# Patient Record
Sex: Female | Born: 1945 | Race: Black or African American | Hispanic: No | Marital: Married | Smoking: Former smoker
Health system: Southern US, Community
[De-identification: ages and names within clinical notes are randomized; demographics above are authoritative.]

## PROBLEM LIST (undated history)

## (undated) DIAGNOSIS — E7800 Pure hypercholesterolemia, unspecified: Secondary | ICD-10-CM

## (undated) DIAGNOSIS — I10 Essential (primary) hypertension: Secondary | ICD-10-CM

## (undated) DIAGNOSIS — E876 Hypokalemia: Secondary | ICD-10-CM

## (undated) DIAGNOSIS — E079 Disorder of thyroid, unspecified: Secondary | ICD-10-CM

## (undated) DIAGNOSIS — I639 Cerebral infarction, unspecified: Secondary | ICD-10-CM

---

## 1997-09-13 DIAGNOSIS — Z8679 Personal history of other diseases of the circulatory system: Secondary | ICD-10-CM | POA: Insufficient documentation

## 1997-09-24 ENCOUNTER — Inpatient Hospital Stay (HOSPITAL_COMMUNITY): Admission: EM | Admit: 1997-09-24 | Discharge: 1997-09-28 | Payer: Self-pay | Admitting: Emergency Medicine

## 1997-09-28 ENCOUNTER — Inpatient Hospital Stay (HOSPITAL_COMMUNITY)
Admission: RE | Admit: 1997-09-28 | Discharge: 1997-10-14 | Payer: Self-pay | Admitting: Physical Medicine & Rehabilitation

## 1997-10-17 ENCOUNTER — Encounter
Admission: RE | Admit: 1997-10-17 | Discharge: 1998-01-15 | Payer: Self-pay | Admitting: Physical Medicine & Rehabilitation

## 1998-12-29 ENCOUNTER — Other Ambulatory Visit: Admission: RE | Admit: 1998-12-29 | Discharge: 1998-12-29 | Payer: Self-pay | Admitting: Family Medicine

## 1999-01-18 ENCOUNTER — Ambulatory Visit (HOSPITAL_COMMUNITY): Admission: RE | Admit: 1999-01-18 | Discharge: 1999-01-18 | Payer: Self-pay | Admitting: Internal Medicine

## 1999-01-18 ENCOUNTER — Encounter: Payer: Self-pay | Admitting: Internal Medicine

## 2000-06-10 ENCOUNTER — Ambulatory Visit (HOSPITAL_COMMUNITY): Admission: RE | Admit: 2000-06-10 | Discharge: 2000-06-10 | Payer: Self-pay | Admitting: Internal Medicine

## 2000-06-10 ENCOUNTER — Encounter: Payer: Self-pay | Admitting: Internal Medicine

## 2002-04-02 DIAGNOSIS — Z8639 Personal history of other endocrine, nutritional and metabolic disease: Secondary | ICD-10-CM | POA: Insufficient documentation

## 2002-04-02 DIAGNOSIS — Z862 Personal history of diseases of the blood and blood-forming organs and certain disorders involving the immune mechanism: Secondary | ICD-10-CM

## 2007-04-28 ENCOUNTER — Emergency Department (HOSPITAL_COMMUNITY): Admission: EM | Admit: 2007-04-28 | Discharge: 2007-04-28 | Payer: Self-pay | Admitting: Emergency Medicine

## 2007-05-14 ENCOUNTER — Ambulatory Visit: Payer: Self-pay | Admitting: Internal Medicine

## 2007-05-14 ENCOUNTER — Ambulatory Visit: Payer: Self-pay | Admitting: *Deleted

## 2007-05-14 DIAGNOSIS — E782 Mixed hyperlipidemia: Secondary | ICD-10-CM

## 2007-05-14 DIAGNOSIS — J309 Allergic rhinitis, unspecified: Secondary | ICD-10-CM | POA: Insufficient documentation

## 2007-05-14 DIAGNOSIS — I10 Essential (primary) hypertension: Secondary | ICD-10-CM

## 2007-05-14 LAB — CONVERTED CEMR LAB
Bilirubin Urine: NEGATIVE
Glucose, Urine, Semiquant: NEGATIVE
Ketones, urine, test strip: NEGATIVE
Nitrite: NEGATIVE
Protein, U semiquant: NEGATIVE
Urobilinogen, UA: NEGATIVE

## 2007-05-16 ENCOUNTER — Encounter (INDEPENDENT_AMBULATORY_CARE_PROVIDER_SITE_OTHER): Payer: Self-pay | Admitting: Internal Medicine

## 2007-05-21 LAB — CONVERTED CEMR LAB
AST: 22 units/L (ref 0–37)
Albumin: 4.7 g/dL (ref 3.5–5.2)
Alkaline Phosphatase: 67 units/L (ref 39–117)
BUN: 22 mg/dL (ref 6–23)
Basophils Relative: 1 % (ref 0–1)
CO2: 23 meq/L (ref 19–32)
Calcium: 10.2 mg/dL (ref 8.4–10.5)
Chloride: 103 meq/L (ref 96–112)
Cholesterol: 170 mg/dL (ref 0–200)
Eosinophils Absolute: 0.1 10*3/uL (ref 0.0–0.7)
Eosinophils Relative: 2 % (ref 0–5)
Glucose, Bld: 94 mg/dL (ref 70–99)
HDL: 41 mg/dL (ref >39)
Hemoglobin: 14.8 g/dL (ref 12.0–15.0)
MCHC: 33.9 g/dL (ref 30.0–36.0)
MCV: 91.8 fL (ref 78.0–100.0)
Neutro Abs: 5.3 10*3/uL (ref 1.7–7.7)
RBC: 4.75 M/uL (ref 3.87–5.11)
Sodium: 136 meq/L (ref 135–145)
WBC: 7.8 10*3/uL (ref 4.0–10.5)

## 2007-05-22 ENCOUNTER — Ambulatory Visit: Payer: Self-pay | Admitting: Internal Medicine

## 2007-05-25 ENCOUNTER — Encounter (INDEPENDENT_AMBULATORY_CARE_PROVIDER_SITE_OTHER): Payer: Self-pay | Admitting: Internal Medicine

## 2007-05-27 ENCOUNTER — Encounter (INDEPENDENT_AMBULATORY_CARE_PROVIDER_SITE_OTHER): Payer: Self-pay | Admitting: Internal Medicine

## 2007-05-27 LAB — CONVERTED CEMR LAB
Albumin ELP: 48 % — ABNORMAL LOW (ref 55.8–66.1)
Alpha-2-Globulin: 10.6 % (ref 7.1–11.8)
Beta Globulin: 5.3 % (ref 4.7–7.2)
IgA: 438 mg/dL — ABNORMAL HIGH (ref 68–378)
IgG (Immunoglobin G), Serum: 2480 mg/dL — ABNORMAL HIGH (ref 694–1618)
Total Protein, Serum Electrophoresis: 9.1 g/dL — ABNORMAL HIGH (ref 6.0–8.3)

## 2007-06-02 ENCOUNTER — Encounter (INDEPENDENT_AMBULATORY_CARE_PROVIDER_SITE_OTHER): Payer: Self-pay | Admitting: Internal Medicine

## 2007-06-08 ENCOUNTER — Encounter (INDEPENDENT_AMBULATORY_CARE_PROVIDER_SITE_OTHER): Payer: Self-pay | Admitting: *Deleted

## 2007-06-08 LAB — CONVERTED CEMR LAB

## 2007-07-21 ENCOUNTER — Encounter (INDEPENDENT_AMBULATORY_CARE_PROVIDER_SITE_OTHER): Payer: Self-pay | Admitting: Internal Medicine

## 2007-07-21 ENCOUNTER — Ambulatory Visit: Payer: Self-pay | Admitting: Internal Medicine

## 2007-07-21 DIAGNOSIS — D259 Leiomyoma of uterus, unspecified: Secondary | ICD-10-CM | POA: Insufficient documentation

## 2007-07-21 LAB — CONVERTED CEMR LAB
Bilirubin Urine: NEGATIVE
Blood in Urine, dipstick: NEGATIVE
Ketones, urine, test strip: NEGATIVE
Specific Gravity, Urine: 1.01
Urobilinogen, UA: 1
pH: 5

## 2007-07-27 LAB — CONVERTED CEMR LAB
AST: 21 units/L (ref 0–37)
Albumin: 4.5 g/dL (ref 3.5–5.2)
Basophils Relative: 1 % (ref 0–1)
Chloride: 104 meq/L (ref 96–112)
Eosinophils Absolute: 0.1 10*3/uL (ref 0.0–0.7)
Eosinophils Relative: 1 % (ref 0–5)
GC Probe Amp, Genital: NEGATIVE
HCT: 40.7 % (ref 36.0–46.0)
HDL: 35 mg/dL — ABNORMAL LOW (ref >39)
Hemoglobin: 14.5 g/dL (ref 12.0–15.0)
Lymphocytes Relative: 18 % (ref 12–46)
Lymphs Abs: 1.3 10*3/uL (ref 0.7–4.0)
MCHC: 35.6 g/dL (ref 30.0–36.0)
MCV: 88.3 fL (ref 78.0–100.0)
Monocytes Absolute: 0.4 10*3/uL (ref 0.1–1.0)
Sodium: 138 meq/L (ref 135–145)
Total Bilirubin: 0.6 mg/dL (ref 0.3–1.2)
Total CHOL/HDL Ratio: 4.2
Triglycerides: 196 mg/dL — ABNORMAL HIGH (ref <150)
VLDL: 39 mg/dL (ref 0–40)
WBC: 7.5 10*3/uL (ref 4.0–10.5)

## 2007-08-14 ENCOUNTER — Ambulatory Visit: Payer: Self-pay | Admitting: Internal Medicine

## 2007-08-15 ENCOUNTER — Encounter (INDEPENDENT_AMBULATORY_CARE_PROVIDER_SITE_OTHER): Payer: Self-pay | Admitting: Internal Medicine

## 2007-08-21 ENCOUNTER — Ambulatory Visit: Payer: Self-pay | Admitting: Internal Medicine

## 2007-08-21 DIAGNOSIS — N259 Disorder resulting from impaired renal tubular function, unspecified: Secondary | ICD-10-CM | POA: Insufficient documentation

## 2007-08-21 LAB — CONVERTED CEMR LAB
C3 Complement: 166 mg/dL (ref 88–201)
Collection Interval-CRCL: 24 hr
Complement C4, Body Fluid: 24 mg/dL (ref 16–47)
Creatinine, Urine: 76.9 mg/dL
IgG (Immunoglobin G), Serum: 2220 mg/dL — ABNORMAL HIGH (ref 694–1618)
Sed Rate: 35 mm/hr — ABNORMAL HIGH (ref 0–22)

## 2007-08-26 ENCOUNTER — Ambulatory Visit: Payer: Self-pay | Admitting: Internal Medicine

## 2007-08-30 ENCOUNTER — Encounter (INDEPENDENT_AMBULATORY_CARE_PROVIDER_SITE_OTHER): Payer: Self-pay | Admitting: Internal Medicine

## 2007-09-02 ENCOUNTER — Ambulatory Visit (HOSPITAL_COMMUNITY): Admission: RE | Admit: 2007-09-02 | Discharge: 2007-09-02 | Payer: Self-pay | Admitting: Internal Medicine

## 2007-09-04 ENCOUNTER — Telehealth (INDEPENDENT_AMBULATORY_CARE_PROVIDER_SITE_OTHER): Payer: Self-pay | Admitting: Internal Medicine

## 2007-09-10 ENCOUNTER — Ambulatory Visit: Payer: Self-pay | Admitting: Nurse Practitioner

## 2007-09-11 ENCOUNTER — Ambulatory Visit: Payer: Self-pay | Admitting: Internal Medicine

## 2007-09-11 DIAGNOSIS — R609 Edema, unspecified: Secondary | ICD-10-CM

## 2007-10-23 ENCOUNTER — Ambulatory Visit: Payer: Self-pay | Admitting: Internal Medicine

## 2007-10-23 LAB — CONVERTED CEMR LAB
CO2: 19 meq/L (ref 19–32)
Chloride: 107 meq/L (ref 96–112)
Creatinine, Ser: 1.09 mg/dL (ref 0.40–1.20)
Sodium: 140 meq/L (ref 135–145)

## 2007-11-13 ENCOUNTER — Ambulatory Visit: Payer: Self-pay | Admitting: Internal Medicine

## 2007-11-20 ENCOUNTER — Ambulatory Visit: Payer: Self-pay | Admitting: Internal Medicine

## 2007-12-01 ENCOUNTER — Ambulatory Visit (HOSPITAL_COMMUNITY): Admission: RE | Admit: 2007-12-01 | Discharge: 2007-12-01 | Payer: Self-pay | Admitting: Internal Medicine

## 2007-12-07 ENCOUNTER — Telehealth (INDEPENDENT_AMBULATORY_CARE_PROVIDER_SITE_OTHER): Payer: Self-pay | Admitting: Internal Medicine

## 2008-01-12 ENCOUNTER — Ambulatory Visit: Payer: Self-pay | Admitting: Internal Medicine

## 2008-01-27 ENCOUNTER — Ambulatory Visit: Payer: Self-pay | Admitting: Internal Medicine

## 2008-06-30 ENCOUNTER — Ambulatory Visit: Payer: Self-pay | Admitting: Internal Medicine

## 2008-08-01 ENCOUNTER — Ambulatory Visit: Payer: Self-pay | Admitting: Internal Medicine

## 2008-08-07 LAB — CONVERTED CEMR LAB
AST: 22 units/L (ref 0–37)
Alkaline Phosphatase: 82 units/L (ref 39–117)
BUN: 13 mg/dL (ref 6–23)
Basophils Relative: 1 % (ref 0–1)
Chloride: 104 meq/L (ref 96–112)
Creatinine, Ser: 1.21 mg/dL — ABNORMAL HIGH (ref 0.40–1.20)
Eosinophils Relative: 3 % (ref 0–5)
HDL: 27 mg/dL — ABNORMAL LOW (ref >39)
Lymphocytes Relative: 27 % (ref 12–46)
Lymphs Abs: 1.6 10*3/uL (ref 0.7–4.0)
MCHC: 35.2 g/dL (ref 30.0–36.0)
Monocytes Absolute: 0.6 10*3/uL (ref 0.1–1.0)
Neutro Abs: 3.5 10*3/uL (ref 1.7–7.7)
Platelets: 163 10*3/uL (ref 150–400)
Potassium: 3.5 meq/L (ref 3.5–5.3)
Total Bilirubin: 0.7 mg/dL (ref 0.3–1.2)
Total Protein: 7.8 g/dL (ref 6.0–8.3)

## 2008-09-02 ENCOUNTER — Ambulatory Visit: Payer: Self-pay | Admitting: Internal Medicine

## 2008-09-02 DIAGNOSIS — R002 Palpitations: Secondary | ICD-10-CM

## 2008-09-02 DIAGNOSIS — R1013 Epigastric pain: Secondary | ICD-10-CM

## 2008-09-07 ENCOUNTER — Encounter (INDEPENDENT_AMBULATORY_CARE_PROVIDER_SITE_OTHER): Payer: Self-pay | Admitting: Internal Medicine

## 2008-09-15 ENCOUNTER — Encounter (INDEPENDENT_AMBULATORY_CARE_PROVIDER_SITE_OTHER): Payer: Self-pay | Admitting: Internal Medicine

## 2008-10-21 ENCOUNTER — Ambulatory Visit: Payer: Self-pay | Admitting: Internal Medicine

## 2008-10-21 ENCOUNTER — Encounter (INDEPENDENT_AMBULATORY_CARE_PROVIDER_SITE_OTHER): Payer: Self-pay | Admitting: Internal Medicine

## 2008-10-21 DIAGNOSIS — E042 Nontoxic multinodular goiter: Secondary | ICD-10-CM

## 2008-10-21 DIAGNOSIS — R1310 Dysphagia, unspecified: Secondary | ICD-10-CM | POA: Insufficient documentation

## 2008-10-21 DIAGNOSIS — Z780 Asymptomatic menopausal state: Secondary | ICD-10-CM | POA: Insufficient documentation

## 2008-10-21 LAB — CONVERTED CEMR LAB
Blood in Urine, dipstick: NEGATIVE
Glucose, Urine, Semiquant: NEGATIVE
Nitrite: NEGATIVE
Specific Gravity, Urine: 1.015
WBC Urine, dipstick: NEGATIVE
pH: 5

## 2008-10-27 ENCOUNTER — Encounter (INDEPENDENT_AMBULATORY_CARE_PROVIDER_SITE_OTHER): Payer: Self-pay | Admitting: Internal Medicine

## 2008-10-30 DIAGNOSIS — E039 Hypothyroidism, unspecified: Secondary | ICD-10-CM

## 2008-10-30 LAB — CONVERTED CEMR LAB
Chlamydia, DNA Probe: NEGATIVE
Cholesterol: 128 mg/dL (ref 0–200)
HDL: 32 mg/dL — ABNORMAL LOW (ref >39)
Total CHOL/HDL Ratio: 4
Triglycerides: 233 mg/dL — ABNORMAL HIGH (ref <150)

## 2008-10-31 ENCOUNTER — Encounter (INDEPENDENT_AMBULATORY_CARE_PROVIDER_SITE_OTHER): Payer: Self-pay | Admitting: Internal Medicine

## 2008-11-02 ENCOUNTER — Encounter (INDEPENDENT_AMBULATORY_CARE_PROVIDER_SITE_OTHER): Payer: Self-pay | Admitting: Internal Medicine

## 2008-11-02 ENCOUNTER — Ambulatory Visit (HOSPITAL_COMMUNITY): Admission: RE | Admit: 2008-11-02 | Discharge: 2008-11-02 | Payer: Self-pay | Admitting: Internal Medicine

## 2008-11-02 DIAGNOSIS — M899 Disorder of bone, unspecified: Secondary | ICD-10-CM | POA: Insufficient documentation

## 2008-11-02 DIAGNOSIS — M949 Disorder of cartilage, unspecified: Secondary | ICD-10-CM

## 2008-11-08 ENCOUNTER — Encounter: Admission: RE | Admit: 2008-11-08 | Discharge: 2008-11-08 | Payer: Self-pay | Admitting: Internal Medicine

## 2008-11-09 ENCOUNTER — Encounter (INDEPENDENT_AMBULATORY_CARE_PROVIDER_SITE_OTHER): Payer: Self-pay | Admitting: Internal Medicine

## 2008-12-22 ENCOUNTER — Ambulatory Visit: Payer: Self-pay | Admitting: Internal Medicine

## 2008-12-22 DIAGNOSIS — B079 Viral wart, unspecified: Secondary | ICD-10-CM | POA: Insufficient documentation

## 2008-12-29 ENCOUNTER — Ambulatory Visit: Payer: Self-pay | Admitting: Internal Medicine

## 2009-01-19 ENCOUNTER — Encounter (INDEPENDENT_AMBULATORY_CARE_PROVIDER_SITE_OTHER): Payer: Self-pay | Admitting: *Deleted

## 2009-03-26 IMAGING — US US RENAL
1 series · 14 of 25 positions shown · non-contrast
Comparison: None

CLINICAL DATA: Renal insufficiency.

RENAL/URINARY TRACT ULTRASOUND
TECHNIQUE: Complete ultrasound examination of the urinary tract
was performed including evaluation of the kidneys renal collecting
systems and urinary bladder.

[Series 1: unknown · 0.28mm/px · 14 of 30 slices shown]
[im 1/30]
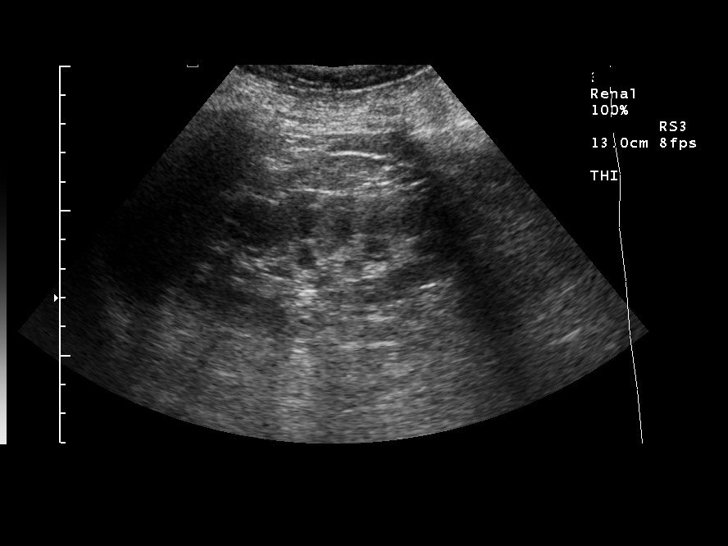
[im 3/30]
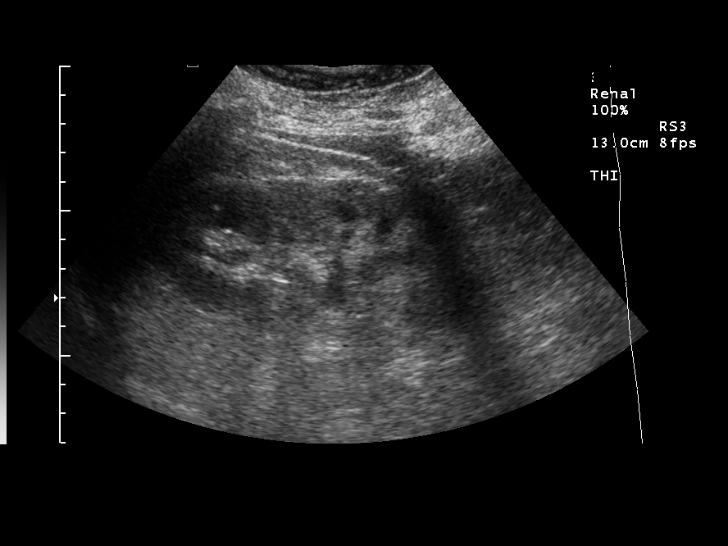
[im 5/30]
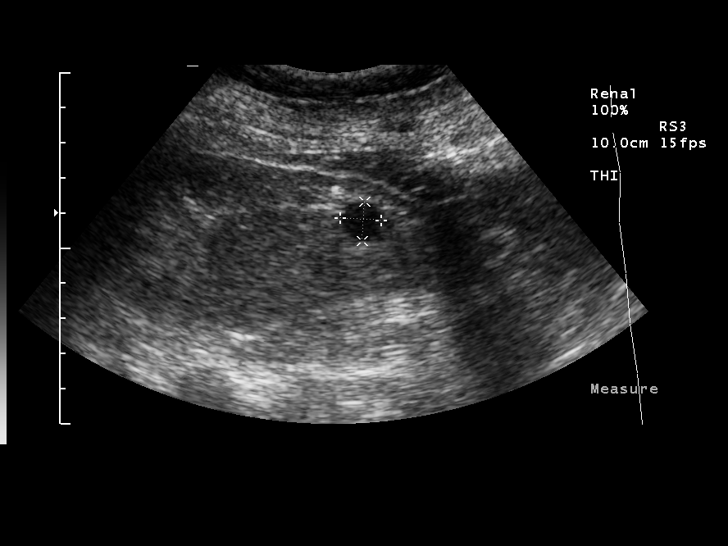
[im 8/30]
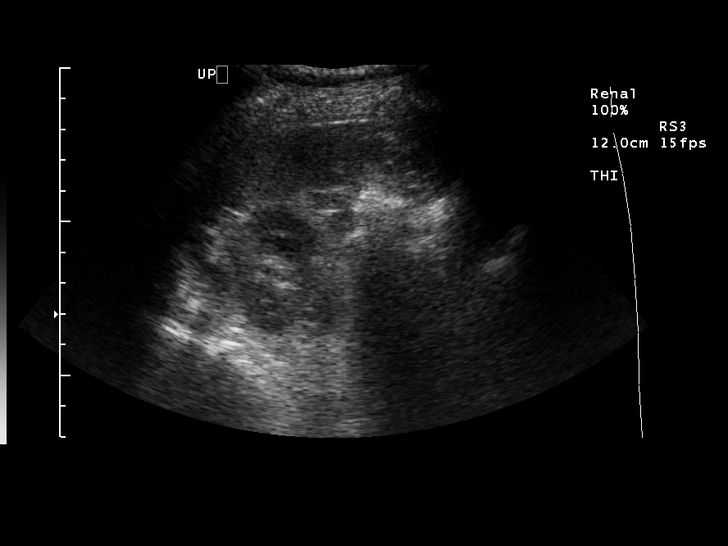
[im 10/30]
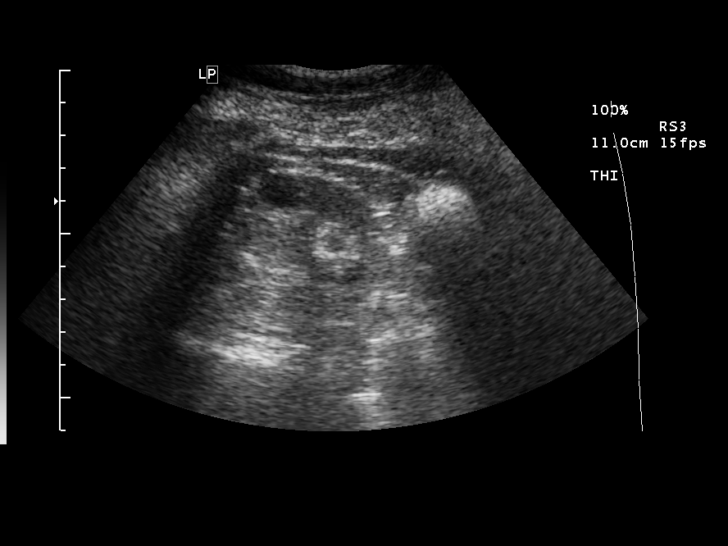
[im 11/30]
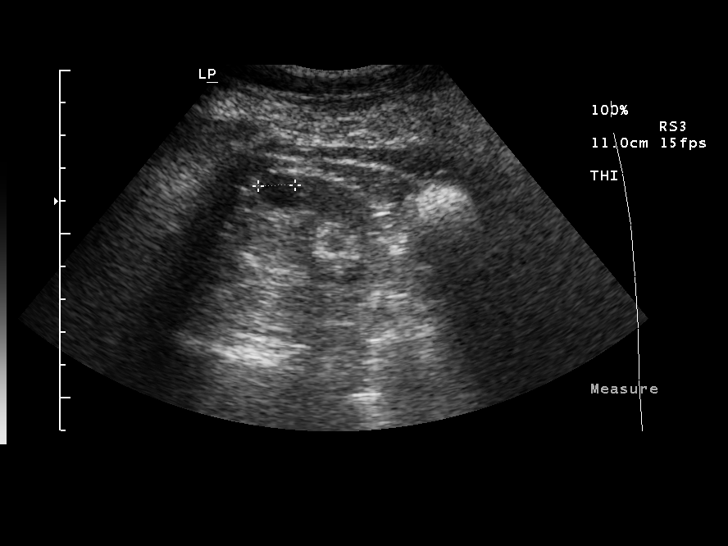
[im 14/30]
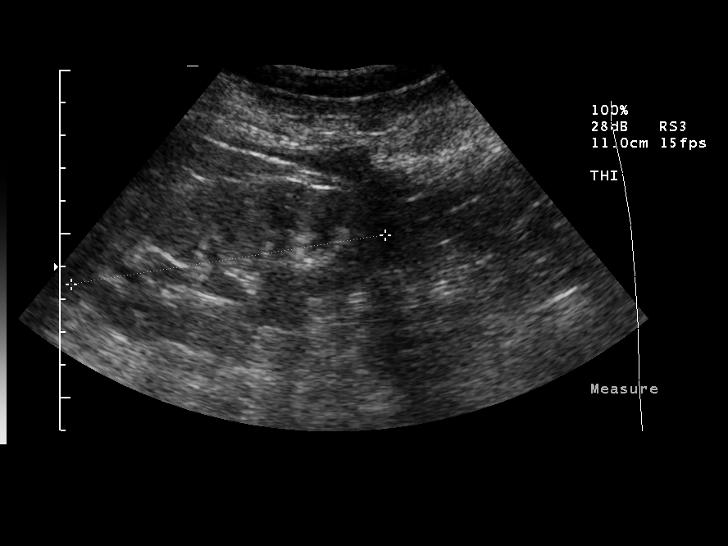
[im 16/30]
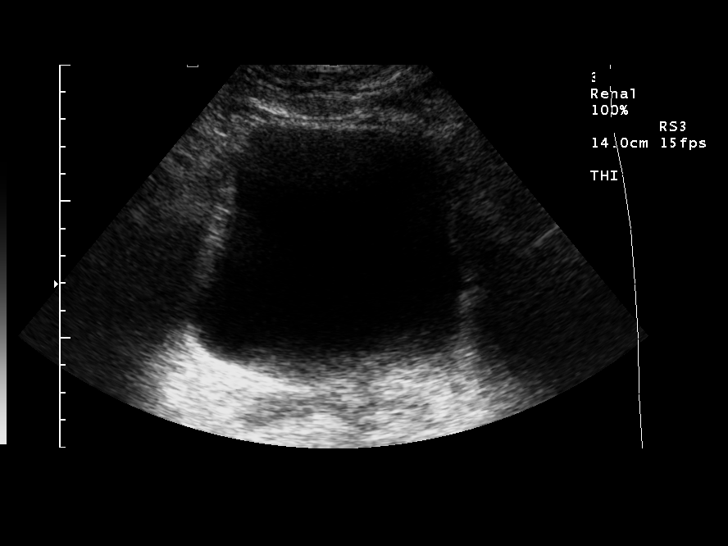
[im 19/30]
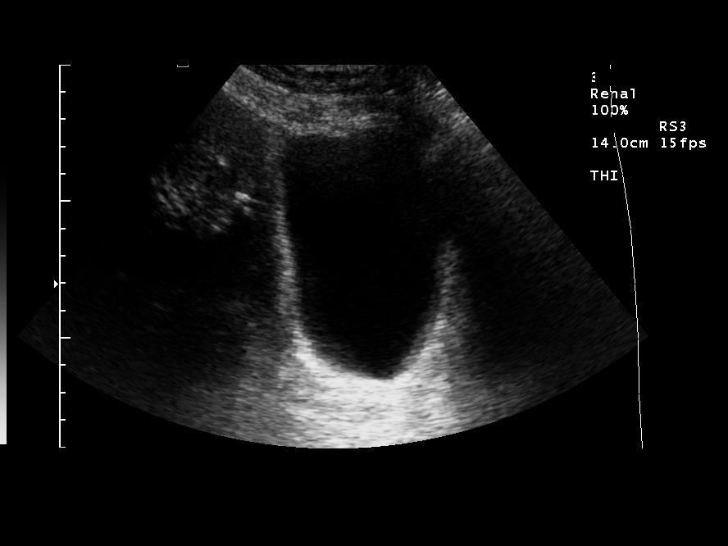
[im 20/30]
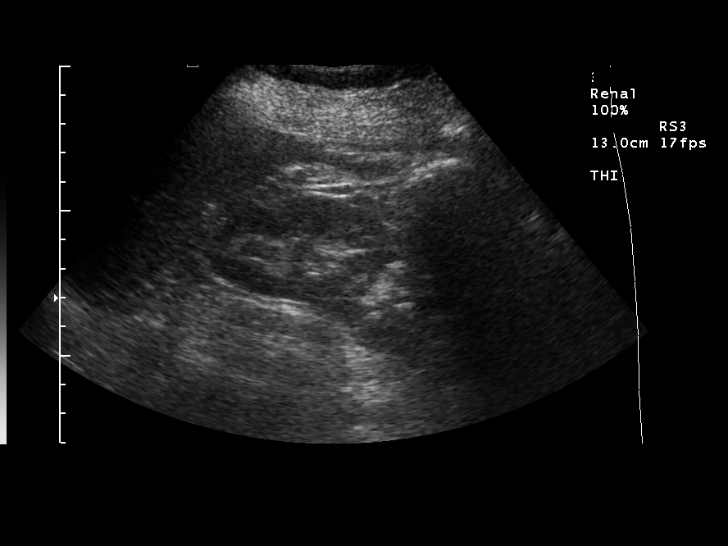
[im 22/30]
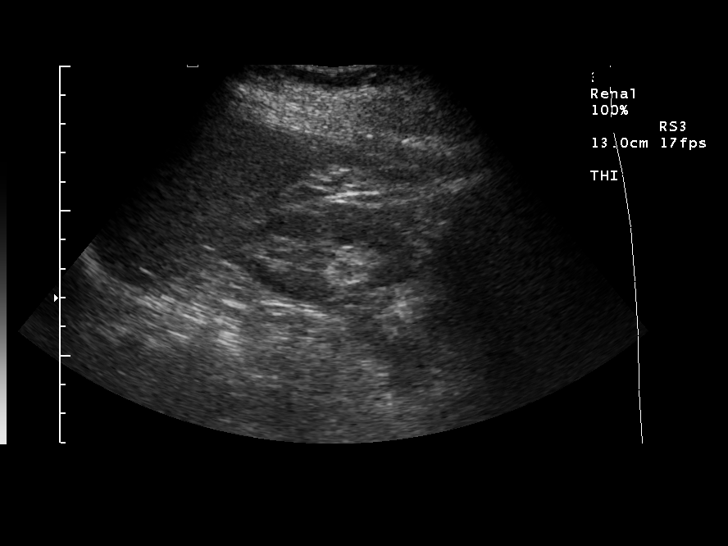
[im 25/30]
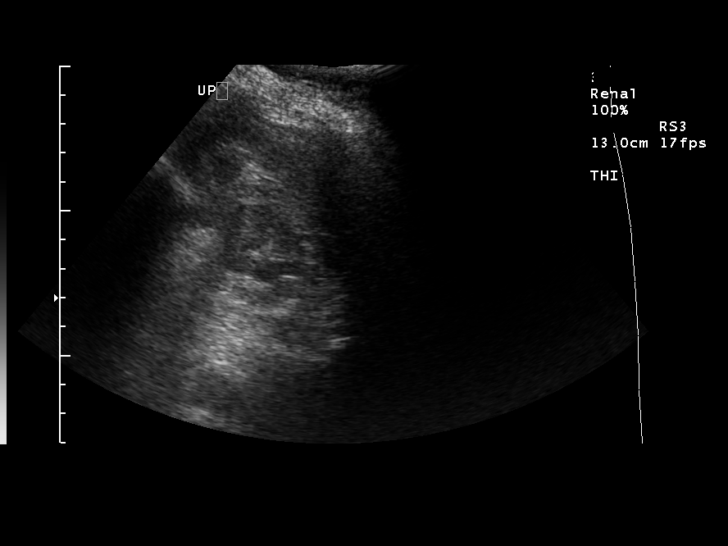
[im 27/30]
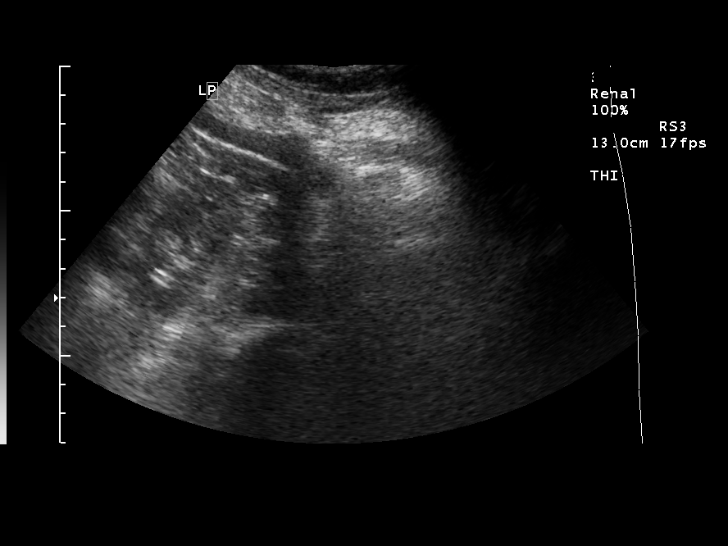
[im 30/30]
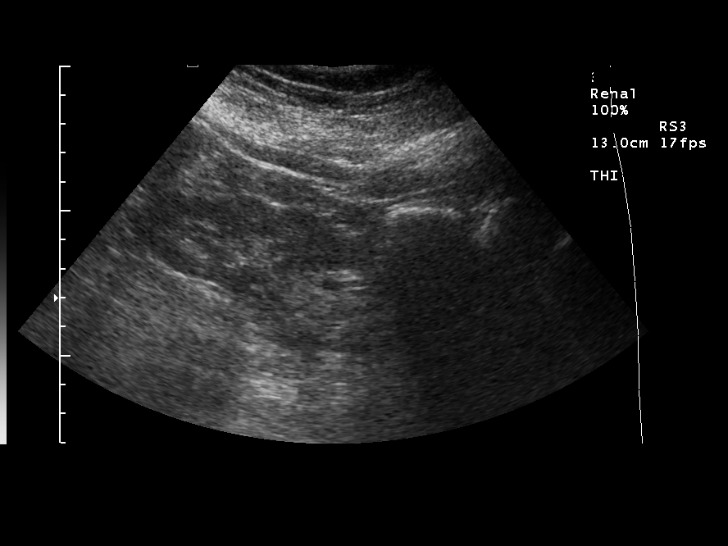

[14 of 25 positions shown; findings below may reference images not displayed]

FINDINGS: There is a simple appearing cyst within the lower pole of the right
kidney measuring 1.2 cm.

The right kidney measures 9.8 cm.  There is mild increased cortical
echogenicity suggesting chronic medical renal disease.

The left kidney measures 9.8 cm.  Mild increased cortical
echogenicity is noted.[

The urinary bladder is unremarkable.
IMPRESSION: 1.  No evidence for acute obstructive uropathy.
2.  Increased renal cortical echogenicity suggests chronic medical
renal disease.
3.  Right renal cyst.

## 2009-06-16 ENCOUNTER — Ambulatory Visit: Payer: Self-pay | Admitting: Internal Medicine

## 2009-06-16 DIAGNOSIS — N63 Unspecified lump in unspecified breast: Secondary | ICD-10-CM

## 2009-06-16 DIAGNOSIS — G4700 Insomnia, unspecified: Secondary | ICD-10-CM

## 2009-06-19 LAB — CONVERTED CEMR LAB
AST: 21 units/L (ref 0–37)
Alkaline Phosphatase: 84 units/L (ref 39–117)
BUN: 11 mg/dL (ref 6–23)
Basophils Absolute: 0 10*3/uL (ref 0.0–0.1)
Calcium: 9.5 mg/dL (ref 8.4–10.5)
Chloride: 101 meq/L (ref 96–112)
Cholesterol: 147 mg/dL (ref 0–200)
Creatinine, Ser: 1.06 mg/dL (ref 0.40–1.20)
HCT: 41.8 % (ref 36.0–46.0)
Hemoglobin: 13.9 g/dL (ref 12.0–15.0)
Lymphs Abs: 1.4 10*3/uL (ref 0.7–4.0)
MCHC: 33.3 g/dL (ref 30.0–36.0)
MCV: 93.3 fL (ref 78.0–100.0)
Neutro Abs: 4.3 10*3/uL (ref 1.7–7.7)
RBC: 4.48 M/uL (ref 3.87–5.11)
TSH: 7.193 microintl units/mL — ABNORMAL HIGH (ref 0.350–4.500)
Total Bilirubin: 0.6 mg/dL (ref 0.3–1.2)
Total CHOL/HDL Ratio: 4.9
Total Protein: 8.5 g/dL — ABNORMAL HIGH (ref 6.0–8.3)

## 2009-06-20 ENCOUNTER — Ambulatory Visit (HOSPITAL_COMMUNITY): Admission: RE | Admit: 2009-06-20 | Discharge: 2009-06-20 | Payer: Self-pay | Admitting: Internal Medicine

## 2009-06-21 ENCOUNTER — Encounter: Admission: RE | Admit: 2009-06-21 | Discharge: 2009-06-21 | Payer: Self-pay | Admitting: Internal Medicine

## 2009-12-19 ENCOUNTER — Ambulatory Visit: Payer: Self-pay | Admitting: Internal Medicine

## 2009-12-19 DIAGNOSIS — M24529 Contracture, unspecified elbow: Secondary | ICD-10-CM

## 2009-12-19 DIAGNOSIS — M25569 Pain in unspecified knee: Secondary | ICD-10-CM

## 2009-12-19 LAB — CONVERTED CEMR LAB
Blood in Urine, dipstick: NEGATIVE
Ketones, urine, test strip: NEGATIVE
Protein, U semiquant: NEGATIVE
Urobilinogen, UA: 0.2

## 2010-01-10 DIAGNOSIS — M109 Gout, unspecified: Secondary | ICD-10-CM

## 2010-01-10 LAB — CONVERTED CEMR LAB: TSH: 5.843 microintl units/mL — ABNORMAL HIGH (ref 0.350–4.500)

## 2010-02-02 ENCOUNTER — Ambulatory Visit (HOSPITAL_COMMUNITY): Admission: RE | Admit: 2010-02-02 | Discharge: 2010-02-02 | Payer: Self-pay | Admitting: Internal Medicine

## 2010-02-17 ENCOUNTER — Encounter (INDEPENDENT_AMBULATORY_CARE_PROVIDER_SITE_OTHER): Payer: Self-pay | Admitting: Internal Medicine

## 2010-03-12 ENCOUNTER — Ambulatory Visit: Payer: Self-pay | Admitting: Internal Medicine

## 2010-03-12 LAB — CONVERTED CEMR LAB
TSH: 2.518 microintl units/mL (ref 0.350–4.500)
Uric Acid, Serum: 7.9 mg/dL — ABNORMAL HIGH (ref 2.4–7.0)

## 2010-03-14 ENCOUNTER — Encounter (INDEPENDENT_AMBULATORY_CARE_PROVIDER_SITE_OTHER): Payer: Self-pay | Admitting: *Deleted

## 2010-04-04 ENCOUNTER — Encounter (INDEPENDENT_AMBULATORY_CARE_PROVIDER_SITE_OTHER): Payer: Self-pay | Admitting: Internal Medicine

## 2010-05-05 ENCOUNTER — Encounter: Payer: Self-pay | Admitting: Internal Medicine

## 2010-05-06 ENCOUNTER — Encounter: Payer: Self-pay | Admitting: Internal Medicine

## 2010-05-13 LAB — CONVERTED CEMR LAB
Albumin: 4.2 g/dL (ref 3.5–5.2)
HDL: 29 mg/dL — ABNORMAL LOW (ref >39)
Total CHOL/HDL Ratio: 4.5
Total Protein: 8.8 g/dL — ABNORMAL HIGH (ref 6.0–8.3)
Triglycerides: 429 mg/dL — ABNORMAL HIGH (ref <150)

## 2010-05-15 NOTE — Assessment & Plan Note (Signed)
Summary: 4 MONTH FU FROM SEPT//KT   Vital Signs:  Patient profile:   65 year old female Menstrual status:  postmenopausal Weight:      127.5 pounds Temp:     97.9 degrees F Pulse rate:   72 / minute Pulse rhythm:   regular Resp:     16 per minute BP sitting:   130 / 80  (left arm) Cuff size:   regular  Vitals Entered By: Vesta Mixer CMA (June 16, 2009 9:08 AM) CC: 4 month f/u.   Had a swallow of juice today. Is Patient Diabetic? No Pain Assessment Patient in pain? no       Does patient need assistance? Ambulation Impaired:Risk for fall   CC:  4 month f/u.   Had a swallow of juice today.Marland Kitchen  History of Present Illness: 1.  Hx of Stroke:  no new neurologic symptoms--speech, or new numbness, tingling or weakness.  Getting around okay.  2.  Hypertension:  Has been controlled--bp okay at pharmacy when checked.  Does walk a lot and own housework, cooking--stays physically active.    3.  Hypothyroidism and thyroid nodule:  was a bit underreplaced  last summer, but did not get pt. back in to recheck for some reason.  Never underwent thyroid ultrasound.  Pt. states got appt. mixed up and then did not have a working phone.  Now does.  4.  Abnormal left breast ultrasound and mammogram:  did not follow up from July 2010 studies in 6 months as recommended--again--no working phone.  Was supposed to have in January.  5.  Skin tag:  healed fine--no concerns.  6.  Hyperlipidemia: Taking Lipitor and Lovaza.  Trying to eat a healthy diet.  7.  Insomnia:  goes to bed at 11:30--sits in front of TV.  Sleeps about 2-3 hours and then up--goes back to TV.  Drinks Pepsi sometimes in afternoon.  Outdoors generally daily.  Generally set sleep and waking times.  Sometimes naps for short period of time during day.  Current Medications (verified): 1)  Clonidine Hcl 0.1 Mg Tabs (Clonidine Hcl) .Marland Kitchen.. 1 Tab By Mouth Twice A Day 2)  Baclofen 10 Mg  Tabs (Baclofen) .Marland Kitchen.. 1 Tab By Mouth Q Hs As Needed 3)   Lipitor 10 Mg  Tabs (Atorvastatin Calcium) .Marland Kitchen.. 1 By Mouth Once Daily 4)  Adult Aspirin Ec Low Strength 81 Mg  Tbec (Aspirin) .Marland Kitchen.. 1 Tab By Mouth Daily 5)  Klor-Con M20 20 Meq  Tbcr (Potassium Chloride Crys Cr) .Marland Kitchen.. 1 Tab By Mouth Two Times A Day 6)  Allegra 180 Mg Tabs (Fexofenadine Hcl) .Marland Kitchen.. 1 By Mouth Once Daily 7)  Furosemide 40 Mg  Tabs (Furosemide) .Marland Kitchen.. 1 Tab By Mouth Q A.m. 8)  Labetalol Hcl 100 Mg  Tabs (Labetalol Hcl) .Marland Kitchen.. 1 Tab By Mouth Two Times A Day 9)  Tylenol Extra Strength 500 Mg Tabs (Acetaminophen) .... 2 Tabs By Mouth Two Times A Day 10)  Lovaza 1 Gm Caps (Omega-3-Acid Ethyl Esters) .... 4 Caps By Mouth Daily 11)  Protonix 40 Mg Tbec (Pantoprazole Sodium) .Marland Kitchen.. 1 Cap By Mouth Daily 12)  Levothyroxine Sodium 25 Mcg Tabs (Levothyroxine Sodium) .Marland Kitchen.. 1 Tab By Mouth Daily  Allergies (verified): No Known Drug Allergies  Physical Exam  General:  NAD.  Chronic flexion contractures of right arm and hand Neck:  left thyroid nodule--about 1 cm or a bit larger Lungs:  Normal respiratory effort, chest expands symmetrically. Lungs are clear to auscultation, no crackles or  wheezes. Heart:  Normal rate and regular rhythm. S1 and S2 normal without gallop, murmur, click, rub or other extra sounds.  Radial pulses normal and equal Abdomen:  area of skin tag removal with little scarring--well healed Extremities:  No edema Neurologic:  As in general exam--right sided weakness without change.   Impression & Recommendations:  Problem # 1:  INSOMNIA (ICD-780.52) Discussed stopping TV usage to fall and resume sleep Better sleep hygiene One time fill of Zolpidem Her updated medication list for this problem includes:    Zolpidem Tartrate 5 Mg Tabs (Zolpidem tartrate) .Marland Kitchen... 1 tab by mouth at bedtime as needed  Problem # 2:  BREAST MASS, LEFT (ICD-611.72) [facility] up Orders: Mammogram (Diagnostic) (Mammo)  Problem # 3:  THYROID NODULE, LEFT  (ICD-241.0) Send back for thryoid ultrasound and TSH, FT4 today  Problem # 4:  RENAL INSUFFICIENCY (ICD-588.9)  labs today  Orders: UA Dipstick w/o Micro (manual) (04540)  Problem # 5:  MIXED HYPERLIPIDEMIA (ICD-272.2) Not fasting today, but only a couple swallows of fruit punch Not at goal previously Her updated medication list for this problem includes:    Lipitor 10 Mg Tabs (Atorvastatin calcium) .Marland Kitchen... 1 by mouth once daily    Lovaza 1 Gm Caps (Omega-3-acid ethyl esters) .Marland KitchenMarland KitchenMarland KitchenMarland Kitchen 4 caps by mouth daily  Orders: T-Lipid Profile ([id number])  Problem # 6:  HYPERTENSION (ICD-401.9) Controlled--follow Her updated medication list for this problem includes:    Clonidine Hcl 0.1 Mg Tabs (Clonidine hcl) .Marland Kitchen... 1 tab by mouth twice a day    Furosemide 40 Mg Tabs (Furosemide) .Marland Kitchen... 1 tab by mouth q a.m.    Labetalol Hcl 100 Mg Tabs (Labetalol hcl) .Marland Kitchen... 1 tab by mouth two times a day  Complete Medication List: 1)  Clonidine Hcl 0.1 Mg Tabs (Clonidine hcl) .Marland Kitchen.. 1 tab by mouth twice a day 2)  Baclofen 10 Mg Tabs (Baclofen) .Marland Kitchen.. 1 tab by mouth q hs as needed 3)  Lipitor 10 Mg Tabs (Atorvastatin calcium) .Marland Kitchen.. 1 by mouth once daily 4)  Adult Aspirin Ec Low Strength 81 Mg Tbec (Aspirin) .Marland Kitchen.. 1 tab by mouth daily 5)  Klor-con M20 20 Meq Tbcr (Potassium chloride crys cr) .Marland Kitchen.. 1 tab by mouth two times a day 6)  Allegra 180 Mg Tabs (Fexofenadine hcl) .Marland Kitchen.. 1 by mouth once daily 7)  Furosemide 40 Mg Tabs (Furosemide) .Marland Kitchen.. 1 tab by mouth q a.m. 8)  Labetalol Hcl 100 Mg Tabs (Labetalol hcl) .Marland Kitchen.. 1 tab by mouth two times a day 9)  Tylenol Extra Strength 500 Mg Tabs (Acetaminophen) .... 2 tabs by mouth two times a day 10)  Lovaza 1 Gm Caps (Omega-3-acid ethyl esters) .... 4 caps by mouth daily 11)  Protonix 40 Mg Tbec (Pantoprazole sodium) .Marland Kitchen.. 1 cap by mouth daily 12)  Levothyroxine Sodium 25 Mcg Tabs (Levothyroxine sodium) .Marland Kitchen.. 1 tab by mouth daily 13)  Zolpidem Tartrate 5 Mg Tabs (Zolpidem tartrate)  .Marland Kitchen.. 1 tab by mouth at bedtime as needed  Other Orders: T-TSH ([id number]) T-T4, Free [phone]) T-Comprehensive Metabolic Panel [phone]) T-CBC w/Diff ([id number]) Ultrasound (Ultrasound)  Patient Instructions: 1)  Follow up with Dr. Delrae Alfred in 4 months --CPP Prescriptions: ZOLPIDEM TARTRATE 5 MG TABS (ZOLPIDEM TARTRATE) 1 tab by mouth at bedtime as needed  #15 x 0   Entered and Authorized by:   Julieanne Manson MD   Signed by:   Julieanne Manson MD on 06/16/2009   Method used:   Print then  Give to Patient   RxID:   [id number]

## 2010-05-15 NOTE — Progress Notes (Signed)
Summary: Office Visit//DEPRESSION SCREENING  Office Visit//DEPRESSION SCREENING   Imported By: Arta Bruce 12/25/2009 11:34:56  _____________________________________________________________________  External Attachment:    Type:   Image     Comment:   External Document

## 2010-05-15 NOTE — Letter (Signed)
Summary: *HSN Results Follow up  [facility]  [address] [place], [place] [address]   Phone: [phone]  Fax: [phone]      02/17/2010   [name] [address] [place], [place]  [address]   Dear  Ms. Victoria Mitchell,                            ____S.Drinkard,FNP   ____D. Gore,FNP       ____B. McPherson,MD   ____V. Rankins,MD    _X___E. Mulberry,MD    ____N. Daphine Deutscher, FNP  ____D. Reche Dixon, MD    ____K. Philipp Deputy, MD    ____Other     This letter is to inform you that your recent test(s):  _______Pap Smear    _______Lab Test     ___X____X-ray    ___X____ is within acceptable limits  _______ requires a medication change  _______ requires a follow-up lab visit  _______ requires a follow-up visit with your provider   Comments:  Ultrasound of your thyroid was stable--all of the little nodules in your thyroid are the same size.  Will intermittently check this with ultrasound, especially if it seems like a nodule is enlarging       _________________________________________________________ If you have any questions, please contact our office                     Sincerely,  Victoria Manson MD [facility]

## 2010-05-15 NOTE — Letter (Signed)
Summary: TEST ORDER FORM//ULTRASOUND//APPT DATE & TIME  TEST ORDER FORM//ULTRASOUND//APPT DATE & TIME   Imported By: Arta Bruce 08/11/2009 11:14:49  _____________________________________________________________________  External Attachment:    Type:   Image     Comment:   External Document

## 2010-05-15 NOTE — Letter (Signed)
Summary: *HSN Results Follow up  [facility]  [address] [place], [place] [address]   Phone: [phone]  Fax: [phone]      03/14/2010   Rucha D Jurado [address] [place], [place]  [address]   Dear  Ms. Lenoria Grupp,                            ____S.Drinkard,FNP   ____D. Gore,FNP       ____B. McPherson,MD   ____V. Rankins,MD    _X___E. Mulberry,MD    ____N. Daphine Deutscher, FNP  ____D. Reche Dixon, MD    ____K. Philipp Deputy, MD    ____Other     This letter is to inform you that your recent test(s):  _______Pap Smear    _______Lab Test     _______X-ray    _______ is within acceptable limits  _______ requires a medication change  _______ requires a follow-up lab visit  _______ requires a follow-up visit with your provider   Comments: I BEEN TRYING TO REACH YOU BY PHONE BECAUSE YOU NOS TO YOUR APPT WITH PHYSICAL THERAPY YOU CAN CALL [phone] TO MAKE A NEW APPT AT YOUR CONVENIRNCE TIME . THANK YOU        _________________________________________________________ If you have any questions, please contact our office                     Sincerely,  Cheryll Dessert [facility]

## 2010-05-15 NOTE — Assessment & Plan Note (Signed)
Summary: cpp exam///gk   Vital Signs:  Patient profile:   65 year old female Menstrual status:  postmenopausal Height:      62.25 inches Weight:      135.1 pounds BMI:     24.60 Temp:     98.0 degrees F oral Pulse rate:   62 / minute Pulse rhythm:   regular Resp:     20 per minute BP sitting:   136 / 72  (left arm) Cuff size:   regular  Vitals Entered By: Michelle Nasuti (December 19, 2009 10:26 AM) CC: cpp  Does patient need assistance? Ambulation Impaired:Risk for fall Comments pt uses quad cane to ambulate   CC:  cpp.  History of Present Illness: 65 yo female here for CPP.  Concerns:    1.  Right knee pain/arthritis:  Medial knee and lower leg sometimes gets red and swollen--wonders if she might have gout.  Has tried Tylenol 500 mg sometimes twice daily and rubs alcohol on the area.    Allergies: No Known Drug Allergies  Past History:  Past Medical History: Reviewed history from 09/07/2008 and no changes required. 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 None  Family History: Mother, died 16:  MI, htn Father, died 30s:  TB 5 Brothers:  Oldest with MI/CAD, youngest with hx of MI.  72 yo brother with alcoholism and psych issues--hospitalized 2010 for suicide attempt 5 Sisters:  Twin sister with Htn.  Other sisters with slightly elevated bp No of own biologic children--raised her  sister's 3, however.  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 Tobacco Use:  started smoking age 3, quit 1999 after stroke.  Smoked 1 ppd or less Alcohol Use:  None  now Drug Use:  never  Review of Systems General:  Energy is fine.. Eyes:  glasses--gets eyes checked once yearly.. ENT:  Denies decreased hearing. CV:  Denies chest pain or discomfort and palpitations. Resp:  Denies shortness of breath; occasional dry cough. GI:  Denies abdominal pain, bloody stools, constipation, dark tarry stools, and diarrhea. GU:  Denies discharge, dysuria, and urinary frequency. MS:  See HPI. Derm:  Denies lesion(s) and rash. Neuro:  Denies numbness, tingling, and weakness; Has chronic contracture of right hand from stroke. Psych:  Denies anxiety, depression, and suicidal thoughts/plans; PQh 9 SCORED A 3..  Physical Exam  General:  Well-developed,well-nourished,in no acute distress; alert,appropriate and cooperative throughout examination.  Obvious right arm contracture--held across right chest. Head:  Normocephalic and atraumatic without obvious abnormalities. No apparent alopecia or balding. Eyes:  No corneal or conjunctival inflammation noted. EOMI. Perrla. Funduscopic exam benign, without hemorrhages, exudates or papilledema. Vision grossly normal. Ears:  External ear exam shows no significant lesions or deformities.  Otoscopic examination reveals clear canals, tympanic membranes are intact bilaterally without bulging, retraction, inflammation or discharge. Hearing is grossly normal bilaterally. Nose:  External nasal examination shows no deformity or inflammation. Nasal mucosa are pink and moist without lesions or exudates. Mouth:  Oral mucosa and oropharynx without lesions or exudates.  poor dentition and teeth missing.  Neck:  supple and full ROM.  Thyroid nodular with more prominent nodule of  left thyroid Chest Wall:  No deformities, masses, or tenderness noted. Breasts:  No mass, nodules, thickening, tenderness, bulging, retraction, inflamation, nipple discharge or skin changes noted.   Lungs:  Normal respiratory effort, chest expands symmetrically. Lungs are clear to auscultation, no crackles or wheezes. Heart:  Normal rate and regular rhythm. S1 and S2 normal without gallop, murmur, click, rub or other extra sounds. Abdomen:  Bowel sounds positive,abdomen soft and non-tender without masses, organomegaly or hernias noted. Rectal:  No external abnormalities noted. Normal sphincter tone. No rectal masses or tenderness.  Heme negative light brown stool Genitalia:  normal introitus, no external lesions, no vaginal discharge, and mucosa pink and moist.   Uterus globular, biggest nodue in right superior fundal area.  NT.  No adnexal mass or tenderness. Msk:  No deformity or scoliosis noted of thoracic or lumbar spine.   Pulses:  R and L carotid,radial,femoral,dorsalis pedis and posterior tibial pulses are full and equal bilaterally Extremities:  No clubbing, cyanosis, edema, or deformity noted with normal full range of motion of all joints with the exception of right upper arm, which is held in a flexion contracture across her right chest.  Fingers flexed against palm..   Neurologic:  alert & oriented X3, cranial nerves II-XII intact, strength normal in all extremities, and DTRs symmetrical and normal.  Again, with exception of contractured right upper arm. Skin:  Intact without suspicious lesions or rashes Cervical Nodes:  No lymphadenopathy noted Axillary Nodes:  No palpable lymphadenopathy Inguinal Nodes:  No significant adenopathy Psych:  Cognition and judgment appear intact. Alert and cooperative with normal attention span and concentration. No apparent delusions, illusions, hallucinations   Impression & Recommendations:  Problem # 1:  ROUTINE GYNECOLOGICAL EXAMINATION  (ICD-V72.31) No pap today as pap last year normal  Pt. in a long term, monagamous marriage and no symptoms of any thing concerning. Fibroid uterus feels unchanged  Problem # 2:  BREAST MASS, LEFT (ICD-611.72) Has follow up mammogram scheduled.  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 fitted for splint. 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 1 by mouth once daily    Lovaza 1 Gm Caps (Omega-3-acid ethyl esters) .Marland KitchenMarland KitchenMarland KitchenMarland Kitchen 4 caps by mouth daily  Orders: T-Lipid Profile ([id number])  Problem # 6:  HYPERTENSION (ICD-401.9) Controlled Her updated medication list for this problem includes:    Clonidine Hcl 0.1 Mg Tabs (Clonidine hcl) .Marland Kitchen... 1 tab by mouth twice a day    Furosemide 40 Mg Tabs (Furosemide) .Marland Kitchen... 1 tab by mouth q a.m.    Labetalol Hcl 100 Mg Tabs (Labetalol hcl) .Marland Kitchen... 1 tab by mouth two times a day  Complete Medication List: 1)  Clonidine Hcl 0.1 Mg Tabs (Clonidine hcl) .Marland Kitchen.. 1 tab by mouth twice a day 2)  Baclofen 10 Mg Tabs (Baclofen) .Marland Kitchen.. 1 tab by mouth q hs as needed 3)  Lipitor 10 Mg Tabs (Atorvastatin calcium) .Marland Kitchen.. 1 by mouth once daily 4)  Adult Aspirin Ec Low Strength 81 Mg Tbec (Aspirin) .Marland Kitchen.. 1 tab by mouth daily 5)  Klor-con M20 20 Meq Tbcr (Potassium chloride crys cr) .Marland KitchenMarland KitchenMarland Kitchen  1 tab by mouth two times a day 6)  Loratadine 10 Mg Tabs (Loratadine) .Marland Kitchen.. 1 tab by mouth daily 7)  Furosemide 40 Mg Tabs (Furosemide) .Marland Kitchen.. 1 tab by mouth q a.m. 8)  Labetalol Hcl 100 Mg Tabs (Labetalol hcl) .Marland Kitchen.. 1 tab by mouth two times a day 9)  Tylenol Extra Strength 500 Mg Tabs (Acetaminophen) .... 2 tabs by mouth two times a day 10)   Lovaza 1 Gm Caps (Omega-3-acid ethyl esters) .... 4 caps by mouth daily 11)  Protonix 40 Mg Tbec (Pantoprazole sodium) .Marland Kitchen.. 1 cap by mouth daily 12)  Levothyroxine Sodium 25 Mcg Tabs (Levothyroxine sodium) .Marland Kitchen.. 1 tab by mouth daily 13)  Zolpidem Tartrate 5 Mg Tabs (Zolpidem tartrate) .Marland Kitchen.. 1 tab by mouth at bedtime as needed 14)  Calcium-vitamin D 600-200 Mg-unit Tabs (Calcium-vitamin d) .Marland Kitchen.. 1 tab by mouth two times a day  Other Orders: Gastroenterology Referral (GI) T-Uric Acid (Blood) ([id number])  Patient Instructions: 1)  Follow up with Dr. Delrae Alfred in 6 months --hypertension Prescriptions: LORATADINE 10 MG TABS (LORATADINE) 1 tab by mouth daily  #30 x 11   Entered and Authorized by:   Julieanne Manson MD   Signed by:   Julieanne Manson MD on 12/19/2009   Method used:   Faxed to ...       [facility] (retail)       [address] [place], [place]  [address]       Ph: [phone]       Fax: [phone]   RxID:   [id number]    Preventive Care Screening  Prior Values:    Pap Smear:  NEGATIVE FOR INTRAEPITHELIAL LESIONS OR MALIGNANCY. (10/21/2008)    Mammogram:  BI-RADS CATEGORY 3:  Probably benign finding(s) - short interval^MM DIGITAL DIAGNOSTIC UNILAT L (06/21/2009)    Hemoccult:  negative x 3 (08/05/2007)    Bone Density:  Spine T score:  0.2;  T score left neck femur:  -1.4;  T score right neck femur:  -1.2 (11/02/2008)    Last Tetanus Booster:  Tdap (05/14/2007)    Dexa Interp:  Spine T score:  0.2;  T score left neck femur:  -1.4;  T score right neck femur:  -1.2 (11/02/2008)     Mammogram:  has not done her 6 month mammogram for left breast findings--Grandmother died and hasn't had the chance. SBE:  monthly--no changes. Pap:  has never had abnormal.  Last pap 10/2008.  Married for 28 years and monogamous Colonoscopy:  Never.  Laboratory Results   Urine Tests    Routine Urinalysis   Color: lt.  yellow Appearance: Clear Glucose: negative   (Normal Range: Negative) Bilirubin: negative   (Normal Range: Negative) Ketone: negative   (Normal Range: Negative) Spec. Gravity: 1.010   (Normal Range: 1.003-1.035) Blood: negative   (Normal Range: Negative) pH: 7.5   (Normal Range: 5.0-8.0) Protein: negative   (Normal Range: Negative) Urobilinogen: 0.2   (Normal Range: 0-1) Nitrite: negative   (Normal Range: Negative) Leukocyte Esterace: negative   (Normal Range: Negative)        Tetanus/Td Immunization History:    Tetanus/Td # 1:  Tdap (05/14/2007)  Influenza Immunization History:    Influenza # 1:  Fluvax 3+ (01/27/2008)  Pneumovax Immunization History:    Pneumovax # 1:  Pneumovax (01/27/2008)  Other Immunization History:    H1N1 # 1 (09/08/2008)

## 2010-05-17 NOTE — Letter (Signed)
Summary: *HSN Results Follow up  [facility]  [address] [place], [place] [address]   Phone: [phone]  Fax: [phone]      04/04/2010   Victoria Mitchell [address] [place], [place]  [address]   Dear  [name],                            ____S.Drinkard,FNP   ____D. Gore,FNP       ____B. McPherson,MD   ____V. Rankins,MD    __X__E. Mulberry,MD    ____N. Daphine Deutscher, FNP  ____D. Reche Dixon, MD    ____K. Philipp Deputy, MD    ____Other     This letter is to inform you that your recent test(s):  _______Pap Smear    ___X____Lab Test     _______X-ray    ___X____ is within acceptable limits  _______ requires a medication change  _______ requires a follow-up lab visit  _______ requires a follow-up visit with your provider   Comments:  Your Uric Acid level (which is what causes gout) is much lower since starting Allopurinol--it is still slightly high, and would like to increase your dose of Allopurinol.  Have called in the higher dose to pharmacy--start higher dose with next med pick up.  Will recheck your uric acid at your next visit.   Your thryoid testing is normal as well with change in hormone dose (blood test)       _________________________________________________________ If you have any questions, please contact our office                     Sincerely,  Victoria Manson MD [facility]

## 2010-07-04 ENCOUNTER — Telehealth (INDEPENDENT_AMBULATORY_CARE_PROVIDER_SITE_OTHER): Payer: Self-pay | Admitting: Internal Medicine

## 2010-07-12 NOTE — Progress Notes (Signed)
Summary: Query:  Refill Baclofen?  Phone Note Outgoing Call   Summary of Call: Last seen 12/2009, no f/u appt.  Refill Baclofen per protocol? Initial call taken by: Dutch Quint RN,  July 04, 2010 4:54 PM  Follow-up for Phone Call        Fill until 9/12 Follow-up by: Julieanne Manson MD,  July 05, 2010 10:21 AM    Prescriptions: BACLOFEN 10 MG  TABS (BACLOFEN) 1 tab by mouth q hs as needed  #30 Each x 6   Entered by:   Gaylyn Cheers RN   Authorized by:   Julieanne Manson MD   Signed by:   Gaylyn Cheers RN on 07/05/2010   Method used:   Electronically to        [facility] (retail)       [address]       [place], [place]  [address]       Ph: [phone]       Fax: [phone]   RxID:   [id number]

## 2010-09-06 ENCOUNTER — Other Ambulatory Visit (HOSPITAL_COMMUNITY): Payer: Self-pay | Admitting: Internal Medicine

## 2010-09-06 DIAGNOSIS — E042 Nontoxic multinodular goiter: Secondary | ICD-10-CM

## 2011-02-04 ENCOUNTER — Ambulatory Visit (HOSPITAL_COMMUNITY)
Admission: RE | Admit: 2011-02-04 | Discharge: 2011-02-04 | Disposition: A | Discharge status: Home / Self Care | Payer: Self-pay | Source: Ambulatory Visit | Attending: Internal Medicine | Admitting: Internal Medicine

## 2011-02-04 DIAGNOSIS — E042 Nontoxic multinodular goiter: Secondary | ICD-10-CM

## 2011-02-04 DIAGNOSIS — E041 Nontoxic single thyroid nodule: Secondary | ICD-10-CM | POA: Insufficient documentation

## 2012-05-15 ENCOUNTER — Emergency Department (HOSPITAL_COMMUNITY)
Admission: EM | Admit: 2012-05-15 | Discharge: 2012-05-15 | Disposition: A | Discharge status: Home / Self Care | Payer: Self-pay | Attending: Emergency Medicine | Admitting: Emergency Medicine

## 2012-05-15 ENCOUNTER — Encounter (HOSPITAL_COMMUNITY): Payer: Self-pay | Admitting: Emergency Medicine

## 2012-05-15 DIAGNOSIS — Z862 Personal history of diseases of the blood and blood-forming organs and certain disorders involving the immune mechanism: Secondary | ICD-10-CM | POA: Insufficient documentation

## 2012-05-15 DIAGNOSIS — Z760 Encounter for issue of repeat prescription: Secondary | ICD-10-CM | POA: Insufficient documentation

## 2012-05-15 DIAGNOSIS — Z8673 Personal history of transient ischemic attack (TIA), and cerebral infarction without residual deficits: Secondary | ICD-10-CM | POA: Insufficient documentation

## 2012-05-15 DIAGNOSIS — Z87891 Personal history of nicotine dependence: Secondary | ICD-10-CM | POA: Insufficient documentation

## 2012-05-15 DIAGNOSIS — E7800 Pure hypercholesterolemia, unspecified: Secondary | ICD-10-CM | POA: Insufficient documentation

## 2012-05-15 DIAGNOSIS — R259 Unspecified abnormal involuntary movements: Secondary | ICD-10-CM | POA: Insufficient documentation

## 2012-05-15 DIAGNOSIS — Z79899 Other long term (current) drug therapy: Secondary | ICD-10-CM | POA: Insufficient documentation

## 2012-05-15 DIAGNOSIS — I10 Essential (primary) hypertension: Secondary | ICD-10-CM | POA: Insufficient documentation

## 2012-05-15 DIAGNOSIS — Z8639 Personal history of other endocrine, nutritional and metabolic disease: Secondary | ICD-10-CM | POA: Insufficient documentation

## 2012-05-15 HISTORY — DX: Pure hypercholesterolemia, unspecified: E78.00

## 2012-05-15 HISTORY — DX: Essential (primary) hypertension: I10

## 2012-05-15 HISTORY — DX: Hypokalemia: E87.6

## 2012-05-15 HISTORY — DX: Cerebral infarction, unspecified: I63.9

## 2012-05-15 LAB — POCT I-STAT, CHEM 8
Creatinine, Ser: 1.3 mg/dL — ABNORMAL HIGH (ref 0.50–1.10)
HCT: 49 % — ABNORMAL HIGH (ref 36.0–46.0)
Hemoglobin: 16.7 g/dL — ABNORMAL HIGH (ref 12.0–15.0)
Potassium: 3.4 mEq/L — ABNORMAL LOW (ref 3.5–5.1)
Sodium: 140 mEq/L (ref 135–145)

## 2012-05-15 MED ORDER — CLONIDINE HCL 0.2 MG PO TABS: 0.1000 mg | ORAL_TABLET | Freq: Two times a day (BID) | ORAL | Status: DC

## 2012-05-15 MED ORDER — LABETALOL HCL 100 MG PO TABS
100.0000 mg | ORAL_TABLET | Freq: Once | ORAL | Status: AC
  Administered 2012-05-15: 100 mg via ORAL
  Filled 2012-05-15: qty 1

## 2012-05-15 MED ORDER — LABETALOL HCL 5 MG/ML IV SOLN: 60.0000 mg | Freq: Once | INTRAVENOUS | Status: DC

## 2012-05-15 MED ORDER — BACLOFEN 10 MG PO TABS: 10.0000 mg | ORAL_TABLET | Freq: Three times a day (TID) | ORAL | Status: DC

## 2012-05-15 MED ORDER — CLONIDINE HCL 0.1 MG PO TABS
0.1000 mg | ORAL_TABLET | Freq: Once | ORAL | Status: AC
  Administered 2012-05-15: 0.1 mg via ORAL
  Filled 2012-05-15: qty 1

## 2012-05-15 MED ORDER — LABETALOL HCL 100 MG PO TABS: 100.0000 mg | ORAL_TABLET | Freq: Two times a day (BID) | ORAL | Status: DC

## 2012-05-15 NOTE — ED Provider Notes (Signed)
History     CSN: [id number]  Arrival date & time 05/15/12  1013   First MD Initiated Contact with Patient 05/15/12 1020      No chief complaint on file.   (Consider location/radiation/quality/duration/timing/severity/associated sxs/prior treatment) HPI Victoria Mitchell is a 67 y.o. female who presents to ED after being out of her blood pressure medications for 2 wks and out of her baclofen. Pt is a [facility] pt. States ran out of refills. No PCP. Pt states she is having no complaints. She is not having any chest pain, no sob, no new weakness or numbness, no headache. Hx of a stroke, states having spasms on right side which is partially paralized due to the stroke. No other complaints.  Past Medical History  Diagnosis Date  . Hypertension   . Stroke   . Hypercholesterolemia   . Hypokalemia     History reviewed. No pertinent past surgical history.  No family history on file.  History  Substance Use Topics  . Smoking status: Former Games developer  . Smokeless tobacco: Not on file  . Alcohol Use: No    OB History    Grav Para Term Preterm Abortions TAB SAB Ect Mult Living                  Review of Systems  Constitutional: Negative for fever and chills.  HENT: Negative for neck pain and neck stiffness.   Respiratory: Negative.   Cardiovascular: Negative.   Gastrointestinal: Negative.   Neurological: Negative for dizziness, syncope, weakness, light-headedness, numbness and headaches.    Allergies  Review of patient's allergies indicates no known allergies.  Home Medications   Current Outpatient Rx  Name  Route  Sig  Dispense  Refill  . ATORVASTATIN CALCIUM 10 MG PO TABS   Oral   Take 10 mg by mouth daily.         Marland Kitchen BACLOFEN 10 MG PO TABS   Oral   Take 10 mg by mouth daily as needed. Back spasm.         Marland Kitchen CLONIDINE HCL 0.1 MG PO TABS   Oral   Take 0.1 mg by mouth 2 (two) times daily.         . FUROSEMIDE 40 MG PO TABS   Oral   Take 40 mg by mouth daily.        Marland Kitchen LABETALOL HCL 100 MG PO TABS   Oral   Take 100 mg by mouth 2 (two) times daily.         Marland Kitchen LEVOTHYROXINE SODIUM 50 MCG PO TABS   Oral   Take 50 mcg by mouth daily.           BP 176/109  Pulse 115  Temp 97.9 F (36.6 C) (Oral)  Resp 18  SpO2 98%  Physical Exam  Nursing note and vitals reviewed. Constitutional: She is oriented to person, place, and time. She appears well-developed and well-nourished. No distress.  Eyes: Conjunctivae normal are normal.  Neck: Neck supple.  Cardiovascular: Regular rhythm and normal heart sounds.        tachycardic  Pulmonary/Chest: Effort normal and breath sounds normal. No respiratory distress. She has no wheezes. She has no rales.  Abdominal: Soft. Bowel sounds are normal. She exhibits no distension. There is no tenderness. There is no rebound.  Musculoskeletal: She exhibits no edema.       Right arm weakness, 2/5 strength, contracted. R legt weakness, 4/5. Unstable gait, walks with a cane  Neurological: She is alert and oriented to person, place, and time. No cranial nerve deficit.  Skin: Skin is warm and dry.    ED Course  Procedures (including critical care time)  Labs Reviewed - No data to display No results found.  Results for orders placed during the hospital encounter of 05/15/12  POCT I-STAT, CHEM 8      Component Value Range   Sodium 140  135 - 145 mEq/L   Potassium 3.4 (*) 3.5 - 5.1 mEq/L   Chloride 102  96 - 112 mEq/L   BUN 10  6 - 23 mg/dL   Creatinine, Ser 8.65 (*) 0.50 - 1.10 mg/dL   Glucose, Bld 784 (*) 70 - 99 mg/dL   Calcium, Ion 6.96 (*) 1.13 - 1.30 mmol/L   TCO2 27  0 - 100 mmol/L   Hemoglobin 16.7 (*) 12.0 - 15.0 g/dL   HCT 29.5 (*) 28.4 - 13.2 %   No results found.   1. Hypertension   2. Medication refill       MDM  Pt out of BP meds for 2 wks. No refills. BP initially 176/109. She is asymptomatic. Istat chem 8 checked. Appears at baseline. BP down to normal after her usual medications  given in ED, clonidine 0.1, and labetalol 100mg . Pt will be d/c home with close follow up with PCP.     Filed Vitals:   05/15/12 1022 05/15/12 1051 05/15/12 1205  BP: 176/109  113/75  Pulse: 115 100 104  Temp: 97.9 F (36.6 C)    TempSrc: Oral    Resp: 18    SpO2: 98%  95%       Lottie Mussel, PA 05/15/12 1219  Lottie Mussel, PA 05/15/12 1221

## 2012-05-15 NOTE — ED Provider Notes (Signed)
Medical screening examination/treatment/procedure(s) were performed by non-physician practitioner and as supervising physician I was immediately available for consultation/collaboration.  Jalena Vanderlinden, MD 05/15/12 1623 

## 2012-05-15 NOTE — ED Notes (Signed)
Pt states she has been out of her BP med and muscle relaxer x 2 weeks. Former pt of [facility].  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 Also c/o right leg "spasms" in her paralyzed leg.

## 2012-05-15 NOTE — ED Notes (Signed)
Pt denies pain; pt denies blurred vision; pt denies headache; pt denies numbness and tingling; pt denies lightheadedness/dissiness; pt denies difficulty breathing; pt denies chest pain; pt alert and mentating appropriately. Pt does not appear to be in acute distress.

## 2013-03-14 ENCOUNTER — Emergency Department (HOSPITAL_COMMUNITY)
Admission: EM | Admit: 2013-03-14 | Discharge: 2013-03-14 | Disposition: A | Discharge status: Home / Self Care | Payer: Self-pay | Attending: Emergency Medicine | Admitting: Emergency Medicine

## 2013-03-14 ENCOUNTER — Encounter (HOSPITAL_COMMUNITY): Payer: Self-pay | Admitting: Emergency Medicine

## 2013-03-14 DIAGNOSIS — Z79899 Other long term (current) drug therapy: Secondary | ICD-10-CM | POA: Insufficient documentation

## 2013-03-14 DIAGNOSIS — E7800 Pure hypercholesterolemia, unspecified: Secondary | ICD-10-CM | POA: Insufficient documentation

## 2013-03-14 DIAGNOSIS — E039 Hypothyroidism, unspecified: Secondary | ICD-10-CM | POA: Insufficient documentation

## 2013-03-14 DIAGNOSIS — Z87891 Personal history of nicotine dependence: Secondary | ICD-10-CM | POA: Insufficient documentation

## 2013-03-14 DIAGNOSIS — Z8673 Personal history of transient ischemic attack (TIA), and cerebral infarction without residual deficits: Secondary | ICD-10-CM | POA: Insufficient documentation

## 2013-03-14 DIAGNOSIS — I10 Essential (primary) hypertension: Secondary | ICD-10-CM | POA: Insufficient documentation

## 2013-03-14 HISTORY — DX: Disorder of thyroid, unspecified: E07.9

## 2013-03-14 MED ORDER — LEVOTHYROXINE SODIUM 75 MCG PO TABS: 75.0000 ug | ORAL_TABLET | Freq: Every day | ORAL | Status: DC

## 2013-03-14 MED ORDER — CLONIDINE HCL 0.2 MG PO TABS: 0.1000 mg | ORAL_TABLET | Freq: Two times a day (BID) | ORAL | Status: DC

## 2013-03-14 MED ORDER — ATORVASTATIN CALCIUM 10 MG PO TABS: 10.0000 mg | ORAL_TABLET | Freq: Every day | ORAL | Status: DC

## 2013-03-14 MED ORDER — BACLOFEN 10 MG PO TABS: 10.0000 mg | ORAL_TABLET | Freq: Three times a day (TID) | ORAL | Status: DC

## 2013-03-14 MED ORDER — FUROSEMIDE 40 MG PO TABS: 40.0000 mg | ORAL_TABLET | Freq: Every day | ORAL | Status: DC

## 2013-03-14 MED ORDER — LABETALOL HCL 100 MG PO TABS: 100.0000 mg | ORAL_TABLET | Freq: Two times a day (BID) | ORAL | Status: DC

## 2013-03-14 MED ORDER — POTASSIUM CHLORIDE ER 10 MEQ PO CPCR: 20.0000 meq | ORAL_CAPSULE | Freq: Two times a day (BID) | ORAL | Status: DC

## 2013-03-14 NOTE — ED Provider Notes (Signed)
CSN: [id number]     Arrival date & time 03/14/13  1512 History   First MD Initiated Contact with Patient 03/14/13 1716     Chief Complaint  Patient presents with  . Medication Refill  . Hypertension   (Consider location/radiation/quality/duration/timing/severity/associated sxs/prior Treatment) HPI Patient presents with concern of right calf spasm, hypertension. Patient states that she lost her blood pressure medication approximately one month ago.  She has been unable to obtain refills secondary to access issues. She denies current chest pain, lightheadedness, syncope, dyspnea, abdominal pain or any other focal complaints intermittent right calf spasm.  Spasms have been present for greater than one month, per clear precipitant, cease without clear intervention.  She does not have this condition currently Past Medical History  Diagnosis Date  . Hypertension   . Stroke   . Hypercholesterolemia   . Hypokalemia   . Thyroid disease     hypothyroid   History reviewed. No pertinent past surgical history. No family history on file. History  Substance Use Topics  . Smoking status: Former Games developer  . Smokeless tobacco: Not on file  . Alcohol Use: No   OB History   Grav Para Term Preterm Abortions TAB SAB Ect Mult Living                 Review of Systems  All other systems reviewed and are negative.    Allergies  Review of patient's allergies indicates no known allergies.  Home Medications   Current Outpatient Rx  Name  Route  Sig  Dispense  Refill  . atorvastatin (LIPITOR) 10 MG tablet   Oral   Take 10 mg by mouth daily.         . baclofen (LIORESAL) 10 MG tablet   Oral   Take 1 tablet (10 mg total) by mouth 3 (three) times daily.   30 each   0   . cloNIDine (CATAPRES) 0.2 MG tablet   Oral   Take 0.5 tablets (0.1 mg total) by mouth 2 (two) times daily.   60 tablet   1   . furosemide (LASIX) 40 MG tablet   Oral   Take 40 mg by mouth daily.         Marland Kitchen  labetalol (NORMODYNE) 100 MG tablet   Oral   Take 1 tablet (100 mg total) by mouth 2 (two) times daily.   60 tablet   1   . levothyroxine (SYNTHROID, LEVOTHROID) 75 MCG tablet   Oral   Take 75 mcg by mouth daily before breakfast.         . potassium chloride (MICRO-K) 10 MEQ CR capsule   Oral   Take 20 mEq by mouth 2 (two) times daily.          BP 173/105  Pulse 115  Temp(Src) 98.1 F (36.7 C) (Oral)  Resp 16  SpO2 97% Physical Exam  Nursing note and vitals reviewed. Constitutional: She is oriented to person, place, and time. She appears well-developed and well-nourished. No distress.  HENT:  Head: Normocephalic and atraumatic.  Eyes: Conjunctivae and EOM are normal.  Cardiovascular: Normal rate and regular rhythm.   Pulmonary/Chest: Effort normal and breath sounds normal. No stridor. No respiratory distress.  Abdominal: She exhibits no distension.  Musculoskeletal: She exhibits no edema.  Right arm and contracture, no changes according to the patient.  Prior stroke.  Neurological: She is alert and oriented to person, place, and time. No cranial nerve deficit. She exhibits abnormal muscle tone.  Skin: Skin is warm and dry.  Psychiatric: She has a normal mood and affect.    ED Course  Procedures (including critical care time) Labs Review Labs Reviewed - No data to display Imaging Review No results found.  EKG Interpretation   None       MDM  No diagnosis found.This patient presents with concern of hypertension, intermittent calf tenderness.  On exam the patient is awake alert, he hemodynamically stable, though with mild hypertension.  With no evidence of distress, and with one month of no medication, there is no indication for labs, imaging, as the patient has received multiple pressure, and will be started on blood pressure medication regardless.  Patient was provided no description, no resources to obtain refills and further evaluation.  She was discharged in  stable condition.    Gerhard Munch, MD 03/14/13 1800

## 2013-03-14 NOTE — ED Notes (Signed)
Pt originally came to ED stating she wanted a med refill for her muscle relaxants.  States she has been on muscle relaxants since 1999 after her stroke, but is no longer eligible to rcv care at [facility], thus cannot get a med refill.  In triage, pt bp was 184/114 and pt states she is also out of her bp meds.

## 2013-03-14 NOTE — ED Notes (Signed)
Dr.Lockwood at bedside  

## 2013-03-14 NOTE — ED Notes (Signed)
Patient discharged to home with family. NAD.  

## 2014-03-13 ENCOUNTER — Ambulatory Visit (INDEPENDENT_AMBULATORY_CARE_PROVIDER_SITE_OTHER): Payer: Medicare Other | Admitting: Family Medicine

## 2014-03-13 ENCOUNTER — Ambulatory Visit (INDEPENDENT_AMBULATORY_CARE_PROVIDER_SITE_OTHER): Payer: Medicare Other

## 2014-03-13 VITALS — BP 154/86 | HR 68 | Temp 97.5°F | Resp 16 | Ht 63.0 in | Wt 141.0 lb

## 2014-03-13 DIAGNOSIS — R51 Headache: Secondary | ICD-10-CM

## 2014-03-13 DIAGNOSIS — R0602 Shortness of breath: Secondary | ICD-10-CM

## 2014-03-13 DIAGNOSIS — R059 Cough, unspecified: Secondary | ICD-10-CM

## 2014-03-13 DIAGNOSIS — R05 Cough: Secondary | ICD-10-CM

## 2014-03-13 DIAGNOSIS — R6883 Chills (without fever): Secondary | ICD-10-CM

## 2014-03-13 DIAGNOSIS — R519 Headache, unspecified: Secondary | ICD-10-CM

## 2014-03-13 LAB — POCT CBC
Granulocyte percent: 73 %G (ref 37–80)
HCT, POC: 43.6 % (ref 37.7–47.9)
Hemoglobin: 14.1 g/dL (ref 12.2–16.2)
Lymph, poc: 2 (ref 0.6–3.4)
MCH, POC: 29.5 pg (ref 27–31.2)
MCHC: 32.3 g/dL (ref 31.8–35.4)
MCV: 91.1 fL (ref 80–97)
MID (cbc): 0.3 (ref 0–0.9)
MPV: 7.4 fL (ref 0–99.8)
POC Granulocyte: 6.4 (ref 2–6.9)
POC LYMPH PERCENT: 23.1 %L (ref 10–50)
POC MID %: 3.9 %M (ref 0–12)
Platelet Count, POC: 216 10*3/uL (ref 142–424)
RBC: 4.78 M/uL (ref 4.04–5.48)
RDW, POC: 13.3 %
WBC: 8.8 10*3/uL (ref 4.6–10.2)

## 2014-03-13 MED ORDER — AZITHROMYCIN 250 MG PO TABS: ORAL_TABLET | ORAL | Status: DC

## 2014-03-13 MED ORDER — HYDROCODONE-HOMATROPINE 5-1.5 MG/5ML PO SYRP: 5.0000 mL | ORAL_SOLUTION | Freq: Three times a day (TID) | ORAL | Status: DC | PRN

## 2014-03-13 NOTE — Progress Notes (Signed)
Patient ID: Victoria Mitchell MRN: [id number], DOB: 22-Jan-1946, 68 y.o. Date of Encounter: 03/13/2014, 12:20 PM  Primary Physician: Mack Hook, MD  Chief Complaint:  Chief Complaint  Patient presents with  . Cough  . Headache  . Generalized Body Aches    HPI: 68 y.o. year old female presents with a 6 day history of nasal congestion, post nasal drip, sore throat, and cough. Mild sinus pressure. Afebrile. No chills. Nasal congestion thick and green/yellow. Cough is productive of green/yellow sputum and not associated with time of day. Ears feel full, leading to sensation of muffled hearing. Has tried OTC cold preps without success. No GI complaints.   No sick contacts, recent antibiotics, or recent travels.   No leg trauma, sedentary periods, h/o cancer, or tobacco use.  Past Medical History  Diagnosis Date  . Hypertension   . Stroke   . Hypercholesterolemia   . Hypokalemia   . Thyroid disease     hypothyroid     Home Meds: Prior to Admission medications   Medication Sig Start Date End Date Taking? Authorizing Provider  atorvastatin (LIPITOR) 10 MG tablet Take 1 tablet (10 mg total) by mouth daily. 03/14/13  Yes Carmin Muskrat, MD  baclofen (LIORESAL) 10 MG tablet Take 1 tablet (10 mg total) by mouth 3 (three) times daily. 03/14/13  Yes Carmin Muskrat, MD  cloNIDine (CATAPRES) 0.2 MG tablet Take 0.5 tablets (0.1 mg total) by mouth 2 (two) times daily. 03/14/13  Yes Carmin Muskrat, MD  furosemide (LASIX) 40 MG tablet Take 1 tablet (40 mg total) by mouth daily. 03/14/13  Yes Carmin Muskrat, MD  labetalol (NORMODYNE) 100 MG tablet Take 1 tablet (100 mg total) by mouth 2 (two) times daily. 03/14/13  Yes Carmin Muskrat, MD  levothyroxine (SYNTHROID, LEVOTHROID) 75 MCG tablet Take 1 tablet (75 mcg total) by mouth daily before breakfast. 03/14/13  Yes Carmin Muskrat, MD  potassium chloride (MICRO-K) 10 MEQ CR capsule Take 2 capsules (20 mEq total) by mouth 2 (two) times  daily. 03/14/13  Yes Carmin Muskrat, MD    Allergies: No Known Allergies  History   Social History  . Marital Status: Married    Spouse Name: N/A    Number of Children: N/A  . Years of Education: N/A   Occupational History  . Not on file.   Social History Main Topics  . Smoking status: Former Research scientist (life sciences)  . Smokeless tobacco: Not on file  . Alcohol Use: No  . Drug Use: No  . Sexual Activity: Not on file   Other Topics Concern  . 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 rate 16, height 5\' 3"  (1.6 m), weight 141 lb (63.957 kg), SpO2 99 %., Body mass index is 24.98 kg/(m^2). General: Well developed, well nourished, in no acute distress. Head: Normocephalic, atraumatic, eyes without discharge, sclera non-icteric, nares are congested. Bilateral auditory canals clear, TM's are without perforation, pearly grey with reflective cone of light bilaterally. No sinus TTP. Oral cavity moist, dentition normal. Posterior pharynx with post nasal drip and  mild erythema. No peritonsillar abscess or tonsillar exudate. Neck: Supple. No thyromegaly. Full ROM. No lymphadenopathy. Lungs: Coarse breath sounds bilaterally without few bibasilar rales, no wheezes or rhonchi. Breathing is unlabored.  Heart: RRR with S1 S2. No murmurs, rubs, or gallops appreciated. Msk:  Strength and tone normal for age. Extremities: No clubbing or cyanosis. No edema. Neuro: Alert and oriented X  3. Paretic right UE and mild right lower ext.  CNII-XII grossly in tact and slight right facial Psych:  Responds to questions appropriately with a normal affect.   Labs: Results for orders placed or performed during the hospital encounter of 05/15/12  I-STAT, chem 8  Result Value Ref Range   Sodium 140 135 - 145 mEq/L   Potassium 3.4 (L) 3.5 - 5.1 mEq/L   Chloride 102 96 - 112 mEq/L   BUN 10 6 - 23 mg/dL   Creatinine, Ser 1.30 (H) 0.50 - 1.10 mg/dL   Glucose, Bld 134 (H) 70 - 99 mg/dL   Calcium, Ion 1.09 (L) 1.13 - 1.30 mmol/L   TCO2 27 0 - 100 mmol/L   Hemoglobin 16.7 (H) 12.0 - 15.0 g/dL   HCT 49.0 (H) 36.0 - 46.0 %   Results for orders placed or performed in visit on 03/13/14  POCT CBC  Result Value Ref Range   WBC 8.8 4.6 - 10.2 K/uL   Lymph, poc 2.0 0.6 - 3.4   POC LYMPH PERCENT 23.1 10 - 50 %L   MID (cbc) 0.3 0 - 0.9   POC MID % 3.9 0 - 12 %M   POC Granulocyte 6.4 2 - 6.9   Granulocyte percent 73.0 37 - 80 %G   RBC 4.78 4.04 - 5.48 M/uL   Hemoglobin 14.1 12.2 - 16.2 g/dL   HCT, POC 43.6 37.7 - 47.9 %   MCV 91.1 80 - 97 fL   MCH, POC 29.5 27 - 31.2 pg   MCHC 32.3 31.8 - 35.4 g/dL   RDW, POC 13.3 %   Platelet Count, POC 216 142 - 424 K/uL   MPV 7.4 0 - 99.8 fL    [facility] reading (PRIMARY) by  Dr. Joseph Art:  Heavy bronchial markings without infiltrate.    ASSESSMENT AND PLAN:  68 y.o. year old female with bronchitis, past cva with right upper hemiplegia Cough - Plan: DG Chest 2 View, POCT CBC, HYDROcodone-homatropine (HYCODAN) 5-1.5 MG/5ML syrup, azithromycin (ZITHROMAX Z-PAK) 250 MG tablet  Chills - Plan: DG Chest 2 View, POCT CBC, HYDROcodone-homatropine (HYCODAN) 5-1.5 MG/5ML syrup, azithromycin (ZITHROMAX Z-PAK) 250 MG tablet  Shortness of breath - Plan: DG Chest 2 View, POCT CBC, HYDROcodone-homatropine (HYCODAN) 5-1.5 MG/5ML syrup, azithromycin (ZITHROMAX Z-PAK) 250 MG tablet  Intractable episodic headache, unspecified headache  type   - -Mucinex -Tylenol/Motrin prn -Rest/fluids -RTC precautions -RTC 3-5 days if no improvement  Signed, Robyn Haber, MD 03/13/2014 12:20 PM

## 2014-03-13 NOTE — Patient Instructions (Signed)

## 2016-02-15 ENCOUNTER — Ambulatory Visit (INDEPENDENT_AMBULATORY_CARE_PROVIDER_SITE_OTHER): Payer: Medicare HMO | Admitting: Physician Assistant

## 2016-02-15 VITALS — BP 148/76 | HR 75 | Temp 97.8°F | Resp 16 | Ht 63.0 in | Wt 138.2 lb

## 2016-02-15 DIAGNOSIS — M62838 Other muscle spasm: Secondary | ICD-10-CM | POA: Diagnosis not present

## 2016-02-15 DIAGNOSIS — I10 Essential (primary) hypertension: Secondary | ICD-10-CM

## 2016-02-15 DIAGNOSIS — E039 Hypothyroidism, unspecified: Secondary | ICD-10-CM | POA: Diagnosis not present

## 2016-02-15 DIAGNOSIS — E782 Mixed hyperlipidemia: Secondary | ICD-10-CM | POA: Diagnosis not present

## 2016-02-15 LAB — COMPLETE METABOLIC PANEL WITH GFR
ALBUMIN: 4 g/dL (ref 3.6–5.1)
ALK PHOS: 83 U/L (ref 33–130)
ALT: 21 U/L (ref 6–29)
AST: 30 U/L (ref 10–35)
BUN: 7 mg/dL (ref 7–25)
CO2: 23 mmol/L (ref 20–31)
Calcium: 9.3 mg/dL (ref 8.6–10.4)
Chloride: 106 mmol/L (ref 98–110)
Creat: 1.14 mg/dL — ABNORMAL HIGH (ref 0.50–0.99)
GFR, Est African American: 57 mL/min — ABNORMAL LOW (ref >=60)
GFR, Est Non African American: 50 mL/min — ABNORMAL LOW (ref >=60)
GLUCOSE: 113 mg/dL — AB (ref 65–99)
POTASSIUM: 3.8 mmol/L (ref 3.5–5.3)
SODIUM: 139 mmol/L (ref 135–146)
Total Bilirubin: 0.6 mg/dL (ref 0.2–1.2)
Total Protein: 7.9 g/dL (ref 6.1–8.1)

## 2016-02-15 LAB — POC MICROSCOPIC URINALYSIS (UMFC): Mucus: ABSENT

## 2016-02-15 LAB — CBC WITH DIFFERENTIAL/PLATELET
Basophils Absolute: 65 cells/uL (ref 0–200)
Basophils Relative: 1 %
EOS PCT: 4 %
Eosinophils Absolute: 260 cells/uL (ref 15–500)
HCT: 43.9 % (ref 35.0–45.0)
HEMOGLOBIN: 15.6 g/dL — AB (ref 11.7–15.5)
LYMPHS ABS: 1755 {cells}/uL (ref 850–3900)
Lymphocytes Relative: 27 %
MCH: 32 pg (ref 27.0–33.0)
MCHC: 35.5 g/dL (ref 32.0–36.0)
MCV: 90.1 fL (ref 80.0–100.0)
MONOS PCT: 9 %
MPV: 11 fL (ref 7.5–12.5)
Monocytes Absolute: 585 cells/uL (ref 200–950)
NEUTROS ABS: 3835 {cells}/uL (ref 1500–7800)
Neutrophils Relative %: 59 %
PLATELETS: 206 10*3/uL (ref 140–400)
RBC: 4.87 MIL/uL (ref 3.80–5.10)
RDW: 13.4 % (ref 11.0–15.0)
WBC: 6.5 10*3/uL (ref 3.8–10.8)

## 2016-02-15 LAB — THYROID PANEL WITH TSH
Free Thyroxine Index: 1.5 (ref 1.4–3.8)
T3 Uptake: 27 % (ref 22–35)
T4, Total: 5.7 ug/dL (ref 4.5–12.0)
TSH: 0.27 m[IU]/L — AB

## 2016-02-15 LAB — LIPID PANEL
CHOL/HDL RATIO: 4.4 ratio (ref <=5.0)
Cholesterol: 142 mg/dL (ref 125–200)
HDL: 32 mg/dL — AB (ref >=46)
LDL CALC: 37 mg/dL (ref <130)
Triglycerides: 367 mg/dL — ABNORMAL HIGH (ref <150)
VLDL: 73 mg/dL — AB (ref <30)

## 2016-02-15 LAB — POCT URINALYSIS DIP (MANUAL ENTRY)
BILIRUBIN UA: NEGATIVE
Glucose, UA: NEGATIVE
Leukocytes, UA: NEGATIVE
Nitrite, UA: NEGATIVE
PH UA: 5
PROTEIN UA: NEGATIVE
RBC UA: NEGATIVE
UROBILINOGEN UA: 0.2

## 2016-02-15 MED ORDER — BACLOFEN 10 MG PO TABS: 10.0000 mg | ORAL_TABLET | Freq: Three times a day (TID) | ORAL | 3 refills | Status: DC

## 2016-02-15 MED ORDER — LABETALOL HCL 100 MG PO TABS: 100.0000 mg | ORAL_TABLET | Freq: Two times a day (BID) | ORAL | 1 refills | Status: DC

## 2016-02-15 MED ORDER — NONFORMULARY OR COMPOUNDED ITEM: 1.0000 [IU] | 0 refills | Status: DC

## 2016-02-15 MED ORDER — LEVOTHYROXINE SODIUM 75 MCG PO TABS: 75.0000 ug | ORAL_TABLET | Freq: Every day | ORAL | 0 refills | Status: DC

## 2016-02-15 MED ORDER — CLONIDINE HCL 0.2 MG PO TABS: 0.1000 mg | ORAL_TABLET | Freq: Two times a day (BID) | ORAL | 1 refills | Status: DC

## 2016-02-15 MED ORDER — ATORVASTATIN CALCIUM 10 MG PO TABS: 10.0000 mg | ORAL_TABLET | Freq: Every day | ORAL | 0 refills | Status: DC

## 2016-02-15 NOTE — Progress Notes (Signed)
Victoria Mitchell  MRN: [id number] DOB: 23-Jun-1945  Subjective:  Victoria Mitchell is a 70 y.o. female seen in office today for a chief complaint of medication refill. She was followed by Dr. Dinah Mitchell but he closed his practice in [place] so she does not have a PCP. She would like to be followed here. Below are her conditions which she needs refills for:  Hypothyroidism: Managed with Levothyroxine 12mcg once daily. She has been on this dose since 1999. She has not had her thyroid level checked in a long time. She has been out of thyroid medication for three days.  Hypertension: Managed with Clonidine 0.2mg  0.5 tablet BID and lebatalol 100mg  daily. She has been on this dose since 1999. She does not check her blood pressure regularly at home but she will check it sometimes at [facility]. She cannot recall what it typically runs.  Hyperlipidemia: Managed with atorvastatin 10mg  daily. Has been on this since 2014. Her diet consists of ensure and soft foods due to the inability to chew because she does not have any top teeth. She does not drink much water.    Muscle Spasms: Managed with baclofen 10mg  TID as needed for muscle spasms. 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    Review of Systems  Constitutional: Negative for diaphoresis and fatigue.  Eyes: Negative for visual disturbance.  Respiratory: Negative for shortness of breath.   Cardiovascular: Negative for chest pain, palpitations and leg swelling.  Gastrointestinal: Negative for constipation and nausea.  Genitourinary: Negative for hematuria.  Musculoskeletal: Negative for myalgias.  Neurological: Positive for weakness (in right side from prior stroke in 1999). Negative for dizziness, seizures, light-headedness and headaches.    Patient Active Problem List   Diagnosis Date Noted  . GOUT 01/10/2010  . 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 KNEE PAIN, RIGHT  12/19/2009  . BREAST MASS, LEFT 06/16/2009  . INSOMNIA 06/16/2009  . WART, VIRAL 12/22/2008  . OSTEOPENIA 11/02/2008  . Hypothyroidism 10/30/2008  . GOITER, MULTINODULAR 10/21/2008  . DYSPHAGIA UNSPECIFIED 10/21/2008  . POSTMENOPAUSAL STATUS 10/21/2008  . PALPITATIONS 09/02/2008  . EPIGASTRIC PAIN 09/02/2008  . EDEMA, ANKLES 09/11/2007  . RENAL INSUFFICIENCY 08/21/2007  . FIBROIDS, UTERUS 07/21/2007  . 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 Essential hypertension 05/14/2007  . ALLERGIC RHINITIS 05/14/2007  . HYPERTHYROIDISM, HX OF 04/02/2002  . STROKE, HX OF 09/13/1997    Current Outpatient Prescriptions on File Prior to Visit  Medication Sig Dispense Refill  . azithromycin (ZITHROMAX Z-PAK) 250 MG tablet Take as directed on pack (Patient not taking: Reported on 02/15/2016) 6 tablet 0  . furosemide (LASIX) 40 MG tablet Take 1 tablet (40 mg total) by mouth daily. (Patient not taking: Reported on 02/15/2016) 30 tablet 0  . HYDROcodone-homatropine (HYCODAN) 5-1.5 MG/5ML syrup Take 5 mLs by mouth every 8 (eight) hours as needed for cough. (Patient not taking: Reported on 02/15/2016) 120 mL 0  . potassium chloride (MICRO-K) 10 MEQ CR capsule Take 2 capsules (20 mEq total) by mouth 2 (two) times daily. (Patient not taking: Reported on 02/15/2016) 60 capsule 0   No current facility-administered medications on file prior to visit.     No Known Allergies  Objective:  BP (!) 148/76   Pulse 75   Temp 97.8 F (36.6 C) (Oral)   Resp 16   Ht 5\' 3"  (1.6 m)   Wt 138 lb 3.2 oz (  62.7 kg)   SpO2 98%   BMI 24.48 kg/m   Physical Exam  Constitutional: She is oriented to person, place, and time and well-developed, well-nourished, and in no distress.  HENT:  Head: Normocephalic and atraumatic.  Mouth/Throat: Uvula is midline and oropharynx is clear and moist. Mucous membranes are dry. Abnormal dentition. Dental caries present.  Eyes: Conjunctivae are normal. Pupils are equal, round, and reactive to  light.  Neck: Normal range of motion.  Cardiovascular: Normal rate, regular rhythm, normal heart sounds and normal pulses.   Pulmonary/Chest: Effort normal.  Musculoskeletal:       Right upper arm: She exhibits deformity ( right arm permanently in contracted position).       Right forearm: She exhibits deformity ( full paralysis from right elbow to tips of right fingers).       Right lower leg: She exhibits no swelling.       Left lower leg: She exhibits no edema.  Neurological: She is alert and oriented to person, place, and time. Gait (partial paralysis of lower right extremity, uses cane as assistive device ) abnormal.  Skin: Skin is warm.  Psychiatric: Affect normal.  Vitals reviewed.  Results for orders placed or performed in visit on 02/15/16 (from the past 24 hour(s))  POCT urinalysis dipstick     Status: Abnormal   Collection Time: 02/15/16 10:24 AM  Result Value Ref Range   Color, UA yellow yellow   Clarity, UA clear clear   Glucose, UA negative negative   Bilirubin, UA small (A) negative   Ketones, POC UA negative negative   Spec Grav, UA >=1.030    Blood, UA negative negative   pH, UA 5.0    Protein Ur, POC negative negative   Urobilinogen, UA 0.2    Nitrite, UA Negative Negative   Leukocytes, UA Negative Negative  POCT Microscopic Urinalysis ([facility])     Status: None   Collection Time: 02/15/16 10:25 AM  Result Value Ref Range   WBC,UR,HPF,POC None None WBC/hpf   RBC,UR,HPF,POC None None RBC/hpf   Bacteria None None, Too numerous to count   Mucus Absent Absent   Epithelial Cells, UR Per Microscopy None None, Too numerous to count cells/hpf    Assessment and Plan :  1. Hypothyroidism, unspecified type Await lab results - Thyroid Panel With TSH - levothyroxine (SYNTHROID, LEVOTHROID) 75 MCG tablet; Take 1 tablet (75 mcg total) by mouth daily before breakfast.  Dispense: 90 tablet; Refill: 0  2. Essential hypertension -Pt to follow up in 4 weeks for bp  reevaluation. Since she has not been taking her bp at home, I have prescribed a home bp cuff. Informed her to take bp q 3 days and record these values. Her goal is <140/90 and >100/60. Pt instructed to bring these values to next visit so we can see what her bp is running outside the office as it was a little elevated in the office today. She understands and agrees.  - CBC with Differential/Platelet - COMPLETE METABOLIC PANEL WITH GFR - POCT urinalysis dipstick - POCT Microscopic Urinalysis ([facility]) - NONFORMULARY OR COMPOUNDED ITEM; Apply 1 Units topically once a week.  Dispense: 1 each; Refill: 0 - cloNIDine (CATAPRES) 0.2 MG tablet; Take 0.5 tablets (0.1 mg total) by mouth 2 (two) times daily.  Dispense: 30 tablet; Refill: 1 - labetalol (NORMODYNE) 100 MG tablet; Take 1 tablet (100 mg total) by mouth 2 (two) times daily.  Dispense: 30 tablet; Refill: 1  3. Mixed hyperlipidemia Await  results  - Lipid panel - atorvastatin (LIPITOR) 10 MG tablet; Take 1 tablet (10 mg total) by mouth daily.  Dispense: 90 tablet; Refill: 0  4. Muscle spasm of right lower extremity - baclofen (LIORESAL) 10 MG tablet; Take 1 tablet (10 mg total) by mouth 3 (three) times daily.  Dispense: 90 each; Refill: Victoria Mitchell  [facility] 02/15/2016 10:32 AM

## 2016-02-15 NOTE — Patient Instructions (Addendum)
Take blood pressure every 3 days and write these values down. Your goal is <140/90 and >100/60. If you consistently are running at 160/100, I want you to call our office. I would like you to follow up with me in one month with these blood pressure readings so we can see what you are running at as we may need to change/add some medication to your blood pressure management.  You will be contacted by our office in a few days with your lab results.      Hypertension Hypertension is another name for high blood pressure. High blood pressure forces your heart to work harder to pump blood. A blood pressure reading has two numbers, which includes a higher number over a lower number (example: 110/72). HOME CARE   Have your blood pressure rechecked by your doctor.  Only take medicine as told by your doctor. Follow the directions carefully. The medicine does not work as well if you skip doses. Skipping doses also puts you at risk for problems.  Do not smoke.  Monitor your blood pressure at home as told by your doctor. GET HELP IF:  You think you are having a reaction to the medicine you are taking.  You have repeat headaches or feel dizzy.  You have puffiness (swelling) in your ankles.  You have trouble with your vision. GET HELP RIGHT AWAY IF:   You get a very bad headache and are confused.  You feel weak, numb, or faint.  You get chest or belly (abdominal) pain.  You throw up (vomit).  You cannot breathe very well. MAKE SURE YOU:   Understand these instructions.  Will watch your condition.  Will get help right away if you are not doing well or get worse.   This information is not intended to replace advice given to you by your health care provider. Make sure you discuss any questions you have with your health care provider.   Document Released: 09/18/2007 Document Revised: 04/06/2013 Document Reviewed: 01/22/2013 Elsevier Interactive Patient Education 2016 [employer].     IF you received an x-ray today, you will receive an invoice from [facility] Radiology. Please contact [facility] Radiology at [phone] with questions or concerns regarding your invoice.   IF you received labwork today, you will receive an invoice from [employer]. Please contact [facility] at [phone] with questions or concerns regarding your invoice.   Our billing staff will not be able to assist you with questions regarding bills from these companies.  You will be contacted with the lab results as soon as they are available. The fastest way to get your results is to activate your My Chart account. Instructions are located on the last page of this paperwork. If you have not heard from [place] regarding the results in 2 weeks, please contact this office.

## 2016-02-22 MED ORDER — ATORVASTATIN CALCIUM 10 MG PO TABS: 10.0000 mg | ORAL_TABLET | Freq: Every day | ORAL | 0 refills | Status: DC

## 2016-02-22 NOTE — Addendum Note (Signed)
Addended by: Jannette Spanner on: 02/22/2016 11:12 AM   Modules accepted: Orders

## 2016-03-15 ENCOUNTER — Ambulatory Visit (INDEPENDENT_AMBULATORY_CARE_PROVIDER_SITE_OTHER): Payer: Medicare HMO | Admitting: Physician Assistant

## 2016-03-15 DIAGNOSIS — I10 Essential (primary) hypertension: Secondary | ICD-10-CM

## 2016-03-15 MED ORDER — LABETALOL HCL 100 MG PO TABS: 100.0000 mg | ORAL_TABLET | Freq: Two times a day (BID) | ORAL | 0 refills | Status: DC

## 2016-03-15 MED ORDER — CLONIDINE HCL 0.2 MG PO TABS: 0.1000 mg | ORAL_TABLET | Freq: Two times a day (BID) | ORAL | 0 refills | Status: DC

## 2016-03-15 NOTE — Progress Notes (Signed)
    MRN: [id number] DOB: 02/11/1946  Subjective:   Victoria Mitchell is a 70 y.o. female presenting for follow up on Hypertension. She was initially seen by me on 02/15/16 for refills of all of her medications due to losing her current PCP.   Currently managed with clonidine 0.2mg  1/2 tablet BID and labetalol 100mg  BID. Patient is checking blood pressure at home, range is [id number] systolic. Denies lightheadedness, dizziness, chronic headache, double vision, chest pain, shortness of breath, heart racing, palpitations, nausea, vomiting, abdominal pain, hematuria, lower leg swelling. Denies smoking, no alcohol. Denies any other aggravating or relieving factors, no other questions or concerns.  Adan has a current medication list which includes the following prescription(s): atorvastatin, azithromycin, baclofen, clonidine, furosemide, hydrocodone-homatropine, labetalol, levothyroxine, NONFORMULARY OR COMPOUNDED ITEM, and potassium chloride. Also has No Known Allergies.  Mizuki  has a past medical history of Hypercholesterolemia; Hypertension; Hypokalemia; Stroke [facility]); and Thyroid disease. Also  has no past surgical history on file.   Objective:   Vitals: BP 122/72 (BP Location: Right Arm, Patient Position: Sitting, Cuff Size: Normal)   Pulse 81   Temp 98.4 F (36.9 C) (Oral)   Resp 17   Ht 5\' 3"  (1.6 m)   Wt 139 lb (63 kg)   SpO2 97%   BMI 24.62 kg/m   Physical Exam  Constitutional: She is oriented to person, place, and time. She appears well-developed and well-nourished.  HENT:  Head: Normocephalic and atraumatic.  Eyes: Conjunctivae are normal.  Neck: Normal range of motion.  Cardiovascular: Normal rate, regular rhythm and normal heart sounds.   Pulmonary/Chest: Effort normal.  Musculoskeletal:       Right upper arm: She exhibits deformity (Right arm contracted ).       Right lower leg: She exhibits no swelling.       Left lower leg: She exhibits no swelling.  Neurological: She is alert and  oriented to person, place, and time. Gait (uses cane to ambulate) abnormal.  Skin: Skin is warm and dry.  Psychiatric: She has a normal mood and affect.  Vitals reviewed.  BP Readings from Last 3 Encounters:  03/15/16 122/72  02/15/16 (!) 148/76  03/13/14 (!) 154/86    No results found for this or any previous visit (from the past 24 hour(s)).  Assessment and Plan :  1. Essential hypertension -Well controlled in office today.  -Continue monitoring outside of office.  - labetalol (NORMODYNE) 100 MG tablet; Take 1 tablet (100 mg total) by mouth 2 (two) times daily.  Dispense: 180 tablet; Refill: 0 - cloNIDine (CATAPRES) 0.2 MG tablet; Take 0.5 tablets (0.1 mg total) by mouth 2 (two) times daily.  Dispense: 90 tablet; Refill: 0 -Follow up in 2 months for reevaluation and medication refill of all medications. If pt is stable at this time, will consider 6 month refills for all medications.   Tenna Delaine, PA-C  [facility] 03/15/2016 9:21 AM

## 2016-03-15 NOTE — Patient Instructions (Addendum)
  Follow up in 2 months for reevaluation and medication refill. At this point if everything is stable I will give you 6 months worth of refills.  Enjoy your holidays!  Thank you for letting me participate in your health and well being.    IF you received an x-ray today, you will receive an invoice from [facility] Radiology. Please contact [facility] Radiology at [phone] with questions or concerns regarding your invoice.   IF you received labwork today, you will receive an invoice from [employer]. Please contact [facility] at [phone] with questions or concerns regarding your invoice.   Our billing staff will not be able to assist you with questions regarding bills from these companies.  You will be contacted with the lab results as soon as they are available. The fastest way to get your results is to activate your My Chart account. Instructions are located on the last page of this paperwork. If you have not heard from [place] regarding the results in 2 weeks, please contact this office.

## 2016-05-09 ENCOUNTER — Other Ambulatory Visit: Payer: Self-pay | Admitting: Physician Assistant

## 2016-05-09 DIAGNOSIS — E039 Hypothyroidism, unspecified: Secondary | ICD-10-CM

## 2016-05-10 NOTE — Telephone Encounter (Signed)
Meds ordered this encounter  Medications  . levothyroxine (SYNTHROID, LEVOTHROID) 75 MCG tablet    Sig: TAKE ONE TABLET BY MOUTH ONCE DAILY BEFORE BREAKFAST    Dispense:  90 tablet    Refill:  0    To follow-up with Ms. Timmothy Euler in February per last note.

## 2016-05-11 ENCOUNTER — Telehealth: Payer: Self-pay

## 2016-05-11 DIAGNOSIS — E039 Hypothyroidism, unspecified: Secondary | ICD-10-CM

## 2016-05-11 MED ORDER — LEVOTHYROXINE SODIUM 75 MCG PO TABS: ORAL_TABLET | ORAL | 1 refills | Status: DC

## 2016-05-11 NOTE — Telephone Encounter (Signed)
03/2016 last ov 02/2016 last nl tsh

## 2016-07-14 ENCOUNTER — Other Ambulatory Visit: Payer: Self-pay | Admitting: Physician Assistant

## 2016-07-14 DIAGNOSIS — I10 Essential (primary) hypertension: Secondary | ICD-10-CM

## 2016-09-09 ENCOUNTER — Other Ambulatory Visit: Payer: Self-pay | Admitting: Physician Assistant

## 2016-09-09 DIAGNOSIS — I10 Essential (primary) hypertension: Secondary | ICD-10-CM

## 2016-09-09 DIAGNOSIS — E782 Mixed hyperlipidemia: Secondary | ICD-10-CM

## 2016-10-10 ENCOUNTER — Other Ambulatory Visit: Payer: Self-pay | Admitting: Physician Assistant

## 2016-10-10 DIAGNOSIS — I10 Essential (primary) hypertension: Secondary | ICD-10-CM

## 2016-10-10 DIAGNOSIS — E782 Mixed hyperlipidemia: Secondary | ICD-10-CM

## 2016-10-21 ENCOUNTER — Ambulatory Visit (INDEPENDENT_AMBULATORY_CARE_PROVIDER_SITE_OTHER): Payer: Medicare HMO | Admitting: Physician Assistant

## 2016-10-21 ENCOUNTER — Encounter: Payer: Self-pay | Admitting: Physician Assistant

## 2016-10-21 VITALS — BP 165/81 | HR 80 | Temp 98.1°F

## 2016-10-21 DIAGNOSIS — I10 Essential (primary) hypertension: Secondary | ICD-10-CM

## 2016-10-21 DIAGNOSIS — E039 Hypothyroidism, unspecified: Secondary | ICD-10-CM | POA: Diagnosis not present

## 2016-10-21 MED ORDER — LEVOTHYROXINE SODIUM 75 MCG PO TABS: ORAL_TABLET | ORAL | 1 refills | Status: DC

## 2016-10-21 MED ORDER — LABETALOL HCL 100 MG PO TABS: 100.0000 mg | ORAL_TABLET | Freq: Two times a day (BID) | ORAL | 1 refills | Status: DC

## 2016-10-21 MED ORDER — CLONIDINE HCL 0.2 MG PO TABS: ORAL_TABLET | ORAL | 1 refills | Status: DC

## 2016-10-21 NOTE — Patient Instructions (Addendum)
We should have your blood work back within one week and will contact you with these values.    In terms of elevated blood pressure, I would like you to check your blood pressure at least a couple times over the next week outside of the office and document these values. It is best if you check the blood pressure at different times in the day. Your goal is <140/90. If your values are consistently above this goal despite being on the medication, please return to office for further evaluation. If you start to have chest pain, blurred vision, shortness of breath, severe headache, lower leg swelling, or nausea/vomiting please seek care immediately here or at the ED.    If your blood pressure remains controlled 140/90, follow up in 6 months before you run out of your medication. Thank you for letting me participate in your health and well being.     IF you received an x-ray today, you will receive an invoice from [facility] Radiology. Please contact [facility] Radiology at [phone] with questions or concerns regarding your invoice.   IF you received labwork today, you will receive an invoice from [facility]. Please contact [facility] at [phone] with questions or concerns regarding your invoice.   Our billing staff will not be able to assist you with questions regarding bills from these companies.  You will be contacted with the lab results as soon as they are available. The fastest way to get your results is to activate your My Chart account. Instructions are located on the last page of this paperwork. If you have not heard from [place] regarding the results in 2 weeks, please contact this office.

## 2016-10-21 NOTE — Progress Notes (Signed)
    MRN: [id number] DOB: 29-Jan-1946  Subjective:   Victoria Mitchell is a 71 y.o. female presenting for chief complaint of medication refill for HTN and hypothyroidism.   HTN: Currently managed with normodyne '100mg'$  and clonidine 0.'2mg'$ . She has been out of medication for 3 days. Patient is checking blood pressure at home, range is 544-920 systolic. Reports no side effects. Denies lightheadedness, dizziness, chronic headache, double vision, chest pain, shortness of breath, heart racing, palpitations, nausea, vomiting, abdominal pain, hematuria, lower leg swelling. Denies smoking.   Hypothyroidism: Managed with Levothyroxine 61mg once daily. She has been on this dose since 1999. Last TSH was 0.27 with normal T4 and T3.   RRifkyhas a current medication list which includes the following prescription(s): atorvastatin, baclofen, clonidine, labetalol, levothyroxine, and NONFORMULARY OR COMPOUNDED ITEM. Also has No Known Allergies.  RCadance has a past medical history of Hypercholesterolemia; Hypertension; Hypokalemia; Stroke ([facility]; and Thyroid disease. Also  has no past surgical history on file.   Objective:   Vitals: BP (!) 165/81 (BP Location: Right Arm, Patient Position: Sitting, Cuff Size: Normal)   Pulse 80   Temp 98.1 F (36.7 C) (Oral)   Physical Exam  Constitutional: She is oriented to person, place, and time. She appears well-developed and well-nourished.  HENT:  Head: Normocephalic and atraumatic.  Eyes: Conjunctivae are normal.  Neck: Normal range of motion.  Cardiovascular: Normal rate, regular rhythm, normal heart sounds and intact distal pulses.   Pulmonary/Chest: Effort normal and breath sounds normal.  Musculoskeletal:       Right upper arm: She exhibits deformity (Right arm contracted ).       Right lower leg: She exhibits no swelling.       Left lower leg: She exhibits no swelling.  Neurological: She is alert and oriented to person, place, and time.  Uses cane to ambulate.     Skin: Skin is warm and dry.  Psychiatric: She has a normal mood and affect.  Vitals reviewed.   No results found for this or any previous visit (from the past 24 hour(s)).  Assessment and Plan :  1. Essential hypertension Uncontrolled in office. However, pt has been out of medication for 3 days. She is asymptomatic at this time. Instructed to check bp once she is on her medication again. Goal is <140/90. If consistently above this value, return for follow up. Otherwise, return in 6 months. - CMP14+EGFR - labetalol (NORMODYNE) 100 MG tablet; Take 1 tablet (100 mg total) by mouth 2 (two) times daily.  Dispense: 180 tablet; Refill: 1 - cloNIDine (CATAPRES) 0.2 MG tablet; TAKE 1/2 (ONE-HALF) TABLET BY MOUTH TWICE DAILY  Dispense: 90 tablet; Refill: 1  2. Hypothyroidism, unspecified type Labs pending.  - TSH - T3, Free - T4, Free - levothyroxine (SYNTHROID, LEVOTHROID) 75 MCG tablet; TAKE ONE TABLET BY MOUTH ONCE DAILY BEFORE BREAKFAST  Dispense: 90 tablet; Refill: 1[place] PA-C  [facility] 10/21/2016 9:48 AM

## 2016-10-22 LAB — CMP14+EGFR
A/G RATIO: 1.1 — AB (ref 1.2–2.2)
ALK PHOS: 96 IU/L (ref 39–117)
ALT: 16 IU/L (ref 0–32)
AST: 27 IU/L (ref 0–40)
Albumin: 4 g/dL (ref 3.6–4.8)
BUN/Creatinine Ratio: 10 — ABNORMAL LOW (ref 12–28)
BUN: 11 mg/dL (ref 8–27)
Bilirubin Total: 0.6 mg/dL (ref 0.0–1.2)
CALCIUM: 9.6 mg/dL (ref 8.7–10.3)
CHLORIDE: 101 mmol/L (ref 96–106)
CO2: 23 mmol/L (ref 20–29)
Creatinine, Ser: 1.05 mg/dL — ABNORMAL HIGH (ref 0.57–1.00)
GFR calc Af Amer: 63 mL/min/{1.73_m2} (ref >59)
GFR, EST NON AFRICAN AMERICAN: 55 mL/min/{1.73_m2} — AB (ref >59)
Globulin, Total: 3.6 g/dL (ref 1.5–4.5)
Glucose: 101 mg/dL — ABNORMAL HIGH (ref 65–99)
POTASSIUM: 3.8 mmol/L (ref 3.5–5.2)
Sodium: 138 mmol/L (ref 134–144)
Total Protein: 7.6 g/dL (ref 6.0–8.5)

## 2016-10-22 LAB — T3, FREE: T3, Free: 3.3 pg/mL (ref 2.0–4.4)

## 2016-10-22 LAB — TSH: TSH: 0.035 u[IU]/mL — AB (ref 0.450–4.500)

## 2016-10-22 LAB — T4, FREE: Free T4: 1.63 ng/dL (ref 0.82–1.77)

## 2016-10-30 ENCOUNTER — Other Ambulatory Visit: Payer: Self-pay | Admitting: Physician Assistant

## 2016-10-30 DIAGNOSIS — E782 Mixed hyperlipidemia: Secondary | ICD-10-CM

## 2016-11-06 ENCOUNTER — Other Ambulatory Visit: Payer: Self-pay

## 2016-11-06 DIAGNOSIS — E782 Mixed hyperlipidemia: Secondary | ICD-10-CM

## 2016-11-06 MED ORDER — ATORVASTATIN CALCIUM 10 MG PO TABS: 10.0000 mg | ORAL_TABLET | Freq: Every day | ORAL | 1 refills | Status: DC

## 2016-12-02 ENCOUNTER — Telehealth: Payer: Self-pay

## 2016-12-02 NOTE — Telephone Encounter (Signed)
Called pt to schedule Medicare Annual Wellness Visit with nurse health advisor. -nr  

## 2017-01-26 ENCOUNTER — Other Ambulatory Visit: Payer: Self-pay | Admitting: Physician Assistant

## 2017-01-26 DIAGNOSIS — M62838 Other muscle spasm: Secondary | ICD-10-CM

## 2017-01-27 NOTE — Telephone Encounter (Signed)
Please advise/refill Baclofen

## 2017-01-28 NOTE — Telephone Encounter (Signed)
90 day prescribed.  Rtc for additional refills.

## 2017-04-16 ENCOUNTER — Other Ambulatory Visit: Payer: Self-pay | Admitting: Physician Assistant

## 2017-04-16 DIAGNOSIS — I10 Essential (primary) hypertension: Secondary | ICD-10-CM

## 2017-05-09 ENCOUNTER — Other Ambulatory Visit: Payer: Self-pay | Admitting: Physician Assistant

## 2017-05-09 DIAGNOSIS — I10 Essential (primary) hypertension: Secondary | ICD-10-CM

## 2017-05-15 ENCOUNTER — Ambulatory Visit (INDEPENDENT_AMBULATORY_CARE_PROVIDER_SITE_OTHER): Payer: Medicare HMO | Admitting: Physician Assistant

## 2017-05-15 ENCOUNTER — Encounter: Payer: Self-pay | Admitting: Physician Assistant

## 2017-05-15 ENCOUNTER — Other Ambulatory Visit: Payer: Self-pay

## 2017-05-15 VITALS — BP 188/103 | HR 83 | Temp 98.9°F | Resp 18 | Ht 63.34 in | Wt 135.0 lb

## 2017-05-15 DIAGNOSIS — I10 Essential (primary) hypertension: Secondary | ICD-10-CM | POA: Diagnosis not present

## 2017-05-15 DIAGNOSIS — E782 Mixed hyperlipidemia: Secondary | ICD-10-CM | POA: Diagnosis not present

## 2017-05-15 DIAGNOSIS — M62838 Other muscle spasm: Secondary | ICD-10-CM | POA: Diagnosis not present

## 2017-05-15 DIAGNOSIS — E039 Hypothyroidism, unspecified: Secondary | ICD-10-CM | POA: Diagnosis not present

## 2017-05-15 MED ORDER — ATORVASTATIN CALCIUM 10 MG PO TABS: 10.0000 mg | ORAL_TABLET | Freq: Every day | ORAL | 3 refills | Status: DC

## 2017-05-15 MED ORDER — AMLODIPINE BESYLATE 2.5 MG PO TABS: 2.5000 mg | ORAL_TABLET | Freq: Every day | ORAL | 1 refills | Status: DC

## 2017-05-15 MED ORDER — CLONIDINE HCL 0.2 MG PO TABS: ORAL_TABLET | ORAL | 1 refills | Status: DC

## 2017-05-15 MED ORDER — BACLOFEN 10 MG PO TABS: 10.0000 mg | ORAL_TABLET | Freq: Three times a day (TID) | ORAL | 0 refills | Status: DC

## 2017-05-15 MED ORDER — LABETALOL HCL 100 MG PO TABS: 100.0000 mg | ORAL_TABLET | Freq: Two times a day (BID) | ORAL | 1 refills | Status: DC

## 2017-05-15 MED ORDER — LEVOTHYROXINE SODIUM 75 MCG PO TABS: ORAL_TABLET | ORAL | 1 refills | Status: DC

## 2017-05-15 NOTE — Progress Notes (Deleted)
     MRN: [id number] DOB: [birth year]  Subjective:   Victoria Mitchell is a 72 y.o. female presenting for follow up on Hypertension.   Currently managed with ***. Patient {is/are not:32546} checking blood pressure at home, range is *** systolic. Reports ***. Denies lightheadedness, dizziness, chronic headache, double vision, chest pain, shortness of breath, heart racing, palpitations, nausea, vomiting, abdominal pain, hematuria, lower leg swelling. *** smoking, *** alcohol. Denies any other aggravating or relieving factors, no other questions or concerns.  Victoria Mitchell has a current medication list which includes the following prescription(s): atorvastatin, baclofen, clonidine, labetalol, levothyroxine, and NONFORMULARY OR COMPOUNDED ITEM. Also has No Known Allergies.  Victoria Mitchell  has a past medical history of Hypercholesterolemia, Hypertension, Hypokalemia, Stroke ([place]), and Thyroid disease. Also  has no past surgical history on file.   Objective:   Vitals: There were no vitals taken for this visit.  Physical Exam  No results found for this or any previous visit (from the past 24 hour(s)).  Assessment and Plan :  1. Hypothyroidism, unspecified type *** - TSH  2. Essential hypertension *** - CMP14+EGFR   Victoria Delaine, PA-C  [facility] 05/15/2017 1:35 PM

## 2017-05-15 NOTE — Patient Instructions (Addendum)
For high blood pressure, we are going to add amlodipine 2.5 mg to your current regimen. Please take with other medications. Monitor bp outside of office and document these values. Goal is <140/90 and >100/60. If it goes lower than 100/60, stop amlodipine. Follow up in 2 weeks for reevaluation. If you start to have chest pain, blurred vision, shortness of breath, severe headache, lower leg swelling, or nausea/vomiting please seek care immediately here or at the ED.   We should have your lab work back in one week and will contact you with those results. Thank you for letting me participate in your health and well being.     DASH Eating Plan DASH stands for "Dietary Approaches to Stop Hypertension." The DASH eating plan is a healthy eating plan that has been shown to reduce high blood pressure (hypertension). It may also reduce your risk for type 2 diabetes, heart disease, and stroke. The DASH eating plan may also help with weight loss. What are tips for following this plan? General guidelines  Avoid eating more than 2,300 mg (milligrams) of salt (sodium) a day. If you have hypertension, you may need to reduce your sodium intake to 1,500 mg a day.  Limit alcohol intake to no more than 1 drink a day for nonpregnant women and 2 drinks a day for men. One drink equals 12 oz of beer, 5 oz of wine, or 1 oz of hard liquor.  Work with your health care provider to maintain a healthy body weight or to lose weight. Ask what an ideal weight is for you.  Get at least 30 minutes of exercise that causes your heart to beat faster (aerobic exercise) most days of the week. Activities may include walking, swimming, or biking.  Work with your health care provider or diet and nutrition specialist (dietitian) to adjust your eating plan to your individual calorie needs. Reading food labels  Check food labels for the amount of sodium per serving. Choose foods with less than 5 percent of the Daily Value of sodium.  Generally, foods with less than 300 mg of sodium per serving fit into this eating plan.  To find whole grains, look for the word "whole" as the first word in the ingredient list. Shopping  Buy products labeled as "low-sodium" or "no salt added."  Buy fresh foods. Avoid canned foods and premade or frozen meals. Cooking  Avoid adding salt when cooking. Use salt-free seasonings or herbs instead of table salt or sea salt. Check with your health care provider or pharmacist before using salt substitutes.  Do not fry foods. Cook foods using healthy methods such as baking, boiling, grilling, and broiling instead.  Cook with heart-healthy oils, such as olive, canola, soybean, or sunflower oil. Meal planning   Eat a balanced diet that includes: ? 5 or more servings of fruits and vegetables each day. At each meal, try to fill half of your plate with fruits and vegetables. ? Up to 6-8 servings of whole grains each day. ? Less than 6 oz of lean meat, poultry, or fish each day. A 3-oz serving of meat is about the same size as a deck of cards. One egg equals 1 oz. ? 2 servings of low-fat dairy each day. ? A serving of nuts, seeds, or beans 5 times each week. ? Heart-healthy fats. Healthy fats called Omega-3 fatty acids are found in foods such as flaxseeds and coldwater fish, like sardines, salmon, and mackerel.  Limit how much you eat of the following: ?  Canned or prepackaged foods. ? Food that is high in trans fat, such as fried foods. ? Food that is high in saturated fat, such as fatty meat. ? Sweets, desserts, sugary drinks, and other foods with added sugar. ? Full-fat dairy products.  Do not salt foods before eating.  Try to eat at least 2 vegetarian meals each week.  Eat more home-cooked food and less restaurant, buffet, and fast food.  When eating at a restaurant, ask that your food be prepared with less salt or no salt, if possible. What foods are recommended? The items listed may  not be a complete list. Talk with your dietitian about what dietary choices are best for you. Grains Whole-grain or whole-wheat bread. Whole-grain or whole-wheat pasta. Brown rice. [place]. Bulgur. Whole-grain and low-sodium cereals. Pita bread. Low-fat, low-sodium crackers. Whole-wheat flour tortillas. Vegetables Fresh or frozen vegetables (raw, steamed, roasted, or grilled). Low-sodium or reduced-sodium tomato and vegetable juice. Low-sodium or reduced-sodium tomato sauce and tomato paste. Low-sodium or reduced-sodium canned vegetables. Fruits All fresh, dried, or frozen fruit. Canned fruit in natural juice (without added sugar). Meat and other protein foods Skinless chicken or [place]. Ground chicken or [place]. Pork with fat trimmed off. Fish and seafood. Egg whites. Dried beans, peas, or lentils. Unsalted nuts, nut butters, and seeds. Unsalted canned beans. Lean cuts of beef with fat trimmed off. Low-sodium, lean deli meat. Dairy Low-fat (1%) or fat-free (skim) milk. Fat-free, low-fat, or reduced-fat cheeses. Nonfat, low-sodium ricotta or cottage cheese. Low-fat or nonfat yogurt. Low-fat, low-sodium cheese. Fats and oils Soft margarine without trans fats. Vegetable oil. Low-fat, reduced-fat, or light mayonnaise and salad dressings (reduced-sodium). Canola, safflower, olive, soybean, and sunflower oils. Avocado. Seasoning and other foods Herbs. Spices. Seasoning mixes without salt. Unsalted popcorn and pretzels. Fat-free sweets. What foods are not recommended? The items listed may not be a complete list. Talk with your dietitian about what dietary choices are best for you. Grains Baked goods made with fat, such as croissants, muffins, or some breads. Dry pasta or rice meal packs. Vegetables Creamed or fried vegetables. Vegetables in a cheese sauce. Regular canned vegetables (not low-sodium or reduced-sodium). Regular canned tomato sauce and paste (not low-sodium or reduced-sodium).  Regular tomato and vegetable juice (not low-sodium or reduced-sodium). Angie Fava. Olives. Fruits Canned fruit in a light or heavy syrup. Fried fruit. Fruit in cream or butter sauce. Meat and other protein foods Fatty cuts of meat. Ribs. Fried meat. Berniece Salines. Sausage. Bologna and other processed lunch meats. Salami. Fatback. Hotdogs. Bratwurst. Salted nuts and seeds. Canned beans with added salt. Canned or smoked fish. Whole eggs or egg yolks. Chicken or [place] with skin. Dairy Whole or 2% milk, cream, and half-and-half. Whole or full-fat cream cheese. Whole-fat or sweetened yogurt. Full-fat cheese. Nondairy creamers. Whipped toppings. Processed cheese and cheese spreads. Fats and oils Butter. Stick margarine. Lard. Shortening. Ghee. Bacon fat. Tropical oils, such as coconut, palm kernel, or palm oil. Seasoning and other foods Salted popcorn and pretzels. Onion salt, garlic salt, seasoned salt, table salt, and sea salt. Worcestershire sauce. Tartar sauce. Barbecue sauce. Teriyaki sauce. Soy sauce, including reduced-sodium. Steak sauce. Canned and packaged gravies. Fish sauce. Oyster sauce. Cocktail sauce. Horseradish that you find on the shelf. Ketchup. Mustard. Meat flavorings and tenderizers. Bouillon cubes. Hot sauce and Tabasco sauce. Premade or packaged marinades. Premade or packaged taco seasonings. Relishes. Regular salad dressings. Where to find more information:  National Heart, Lung, and [place]: https://wilson-eaton.com/  American Heart Association: www.heart.org Summary  The DASH eating plan is a healthy eating plan that has been shown to reduce high blood pressure (hypertension). It may also reduce your risk for type 2 diabetes, heart disease, and stroke.  With the DASH eating plan, you should limit salt (sodium) intake to 2,300 mg a day. If you have hypertension, you may need to reduce your sodium intake to 1,500 mg a day.  When on the DASH eating plan, aim to eat more fresh fruits and  vegetables, whole grains, lean proteins, low-fat dairy, and heart-healthy fats.  Work with your health care provider or diet and nutrition specialist (dietitian) to adjust your eating plan to your individual calorie needs. This information is not intended to replace advice given to you by your health care provider. Make sure you discuss any questions you have with your health care provider. Document Released: 03/21/2011 Document Revised: 03/25/2016 Document Reviewed: 03/25/2016 Elsevier Interactive Patient Education  2018 [employer].   IF you received an x-ray today, you will receive an invoice from [facility] Radiology. Please contact [facility] Radiology at [phone] with questions or concerns regarding your invoice.   IF you received labwork today, you will receive an invoice from [facility]. Please contact [facility] at [phone] with questions or concerns regarding your invoice.   Our billing staff will not be able to assist you with questions regarding bills from these companies.  You will be contacted with the lab results as soon as they are available. The fastest way to get your results is to activate your My Chart account. Instructions are located on the last page of this paperwork. If you have not heard from [place] regarding the results in 2 weeks, please contact this office.    We recommend that you schedule a mammogram for breast cancer screening. Typically, you do not need a referral to do this. Please contact a local imaging center to schedule your mammogram.  [facility] - [phone]  *ask for the Radiology Department [facility] ([place]) - [phone] or [phone]  [facility] - [phone] [place] [phone] [facility] - [phone]  *ask for the [facility] - [phone]  *ask for the Radiology Department [facility] - [phone]  *ask for  the [place] - [phone]

## 2017-05-15 NOTE — Progress Notes (Signed)
MRN: [id number] DOB: March 24, 1946  Subjective:   Victoria Mitchell is a 72 y.o. female presenting for follow up on all chronic illensses.    Hypothyroidism: Managed with Levothyroxine 76mg once daily. She has been on this dose since 1999.  Hypertension: Managed with Clonidine 0.223m1/2 tablet BID and lebatalol 10043maily. She has been on this dose since 1999. Has been out of clonidine for the past week.  Patient is checking blood pressure at home, range is 140628Mstolic when on both medications. Reports no side effects.  Denies lightheadedness, dizziness, chronic headache, double vision, chest pain, shortness of breath, heart racing, palpitations, nausea, vomiting, abdominal pain, hematuria, lower leg swelling. Denies smoking and alcohol use. Doing house work but no structured exercise. Diet consists of mostly soft foods. Does not add salt. Drinks mostly sprite and lemonade.   Hyperlipidemia: Managed with atorvastatin 68m63mily. Has been on this since 2014.  Muscle Spasms: Managed with baclofen 68mg60m as needed for muscle spasms. 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   Victoria Mitchell current medication list which includes the following prescription(s): atorvastatin, baclofen, clonidine, labetalol, levothyroxine, amlodipine, and NONFORMULARY OR COMPOUNDED ITEM. Also has No Known Allergies.  Victoria Mitchell a past medical history of Hypercholesterolemia, Hypertension, Hypokalemia, Stroke (HCC),Lairdd Thyroid disease. Also  has no past surgical history on file.   Objective:   Vitals: BP (!) 188/103 (BP Location: Left Arm, Patient Position: Sitting, Cuff Size: Normal)   Pulse 83   Temp 98.9 F (37.2 C) (Oral)   Resp 18   Ht 5' 3.34" (1.609 m)   Wt 135 lb (61.2 kg)   BMI 23.66 kg/m   Physical Exam  Constitutional: She is oriented to person, place, and time. She appears well-developed and well-nourished. No distress.    HENT:  Head: Normocephalic and atraumatic.  Eyes: Conjunctivae and EOM are normal. Pupils are equal, round, and reactive to light.  Neck: Normal range of motion.  Cardiovascular: Normal rate, regular rhythm, normal heart sounds and intact distal pulses.  Pulmonary/Chest: Effort normal. She has no wheezes. She has no rhonchi. She has no rales.  Musculoskeletal:       Right upper arm: She exhibits deformity (right arm contracture noted).       Right lower leg: She exhibits no swelling.       Left lower leg: She exhibits no swelling.  Neurological: She is alert and oriented to person, place, and time. Gait (uses cane to ambulate) abnormal.  Skin: Skin is warm and dry.  Psychiatric: She has a normal mood and affect.  Vitals reviewed.   BP Readings from Last 3 Encounters:  05/15/17 (!) 188/103  10/21/16 (!) 165/81  03/15/16 122/72    No results found for this or any previous visit (from the past 24 hour(s)).  Assessment and Plan :  1. Hypothyroidism, unspecified type - TSH - levothyroxine (SYNTHROID, LEVOTHROID) 75 MCG tablet; TAKE ONE TABLET BY MOUTH ONCE DAILY BEFORE BREAKFAST  Dispense: 90 tablet; Refill: 1  2. Essential hypertension Uncontrolled at this time. She is asymptomatic. Pt is out of clonidine but home bp recordings while on both medications has still been elevated. Will add amlodipine 2.5mg t81murrent regimen. Encouraged to check bp outside of office. Goal is <140/90 and >100/60. Plan for follow up in 2 weeks. Given strict ED precautions.  - CMP14+EGFR - CBC with Differential/Platelet -  labetalol (NORMODYNE) 100 MG tablet; Take 1 tablet (100 mg total) by mouth 2 (two) times daily.  Dispense: 180 tablet; Refill: 1 - cloNIDine (CATAPRES) 0.2 MG tablet; TAKE 1/2 (ONE-HALF) TABLET BY MOUTH TWICE DAILY  Dispense: 90 tablet; Refill: 1 - amLODipine (NORVASC) 2.5 MG tablet; Take 1 tablet (2.5 mg total) by mouth daily.  Dispense: 90 tablet; Refill: 1  3. Muscle spasm of right  lower extremity - baclofen (LIORESAL) 10 MG tablet; Take 1 tablet (10 mg total) by mouth 3 (three) times daily. PLEASE RETURN TO OFFICE FOR ADDITIONAL REFILLS.  Dispense: 90 tablet; Refill: 0  4. Mixed hyperlipidemia - Lipid panel - atorvastatin (LIPITOR) 10 MG tablet; Take 1 tablet (10 mg total) by mouth daily.  Dispense: 90 tablet; Refill: [place], PA-C  [facility] 05/15/2017 6:18 PM

## 2017-05-16 LAB — CMP14+EGFR
A/G RATIO: 1 — AB (ref 1.2–2.2)
ALBUMIN: 4.2 g/dL (ref 3.6–4.8)
ALT: 21 IU/L (ref 0–32)
AST: 28 IU/L (ref 0–40)
Alkaline Phosphatase: 99 IU/L (ref 39–117)
BILIRUBIN TOTAL: 0.7 mg/dL (ref 0.0–1.2)
BUN / CREAT RATIO: 6 — AB (ref 12–28)
BUN: 6 mg/dL — AB (ref 8–27)
CALCIUM: 9.6 mg/dL (ref 8.7–10.3)
CO2: 20 mmol/L (ref 20–29)
Chloride: 106 mmol/L (ref 96–106)
Creatinine, Ser: 1.04 mg/dL — ABNORMAL HIGH (ref 0.57–1.00)
GFR, EST AFRICAN AMERICAN: 63 mL/min/{1.73_m2} (ref >59)
GFR, EST NON AFRICAN AMERICAN: 55 mL/min/{1.73_m2} — AB (ref >59)
GLUCOSE: 123 mg/dL — AB (ref 65–99)
Globulin, Total: 4.1 g/dL (ref 1.5–4.5)
Potassium: 3.9 mmol/L (ref 3.5–5.2)
Sodium: 143 mmol/L (ref 134–144)
TOTAL PROTEIN: 8.3 g/dL (ref 6.0–8.5)

## 2017-05-16 LAB — LIPID PANEL
CHOL/HDL RATIO: 3.5 ratio (ref 0.0–4.4)
Cholesterol, Total: 117 mg/dL (ref 100–199)
HDL: 33 mg/dL — AB (ref >39)
LDL CALC: 56 mg/dL (ref 0–99)
Triglycerides: 142 mg/dL (ref 0–149)
VLDL CHOLESTEROL CAL: 28 mg/dL (ref 5–40)

## 2017-05-16 LAB — CBC WITH DIFFERENTIAL/PLATELET
BASOS ABS: 0 10*3/uL (ref 0.0–0.2)
Basos: 1 %
EOS (ABSOLUTE): 0.2 10*3/uL (ref 0.0–0.4)
Eos: 4 %
Hematocrit: 41.3 % (ref 34.0–46.6)
Hemoglobin: 14.8 g/dL (ref 11.1–15.9)
Immature Grans (Abs): 0 10*3/uL (ref 0.0–0.1)
Immature Granulocytes: 0 %
LYMPHS ABS: 1.7 10*3/uL (ref 0.7–3.1)
Lymphs: 29 %
MCH: 31.2 pg (ref 26.6–33.0)
MCHC: 35.8 g/dL — AB (ref 31.5–35.7)
MCV: 87 fL (ref 79–97)
MONOCYTES: 8 %
MONOS ABS: 0.5 10*3/uL (ref 0.1–0.9)
Neutrophils Absolute: 3.4 10*3/uL (ref 1.4–7.0)
Neutrophils: 58 %
PLATELETS: 233 10*3/uL (ref 150–379)
RBC: 4.74 x10E6/uL (ref 3.77–5.28)
RDW: 14.3 % (ref 12.3–15.4)
WBC: 5.8 10*3/uL (ref 3.4–10.8)

## 2017-05-16 LAB — TSH: TSH: 0.082 u[IU]/mL — ABNORMAL LOW (ref 0.450–4.500)

## 2017-05-29 ENCOUNTER — Ambulatory Visit (INDEPENDENT_AMBULATORY_CARE_PROVIDER_SITE_OTHER): Payer: Medicare HMO

## 2017-05-29 ENCOUNTER — Other Ambulatory Visit: Payer: Self-pay

## 2017-05-29 ENCOUNTER — Ambulatory Visit: Payer: Medicare HMO | Admitting: Physician Assistant

## 2017-05-29 ENCOUNTER — Encounter: Payer: Self-pay | Admitting: Physician Assistant

## 2017-05-29 VITALS — BP 132/80 | HR 81 | Ht 63.0 in | Wt 135.5 lb

## 2017-05-29 VITALS — BP 132/80 | HR 81 | Temp 97.8°F | Resp 18 | Ht 63.0 in | Wt 135.8 lb

## 2017-05-29 DIAGNOSIS — E039 Hypothyroidism, unspecified: Secondary | ICD-10-CM

## 2017-05-29 DIAGNOSIS — I10 Essential (primary) hypertension: Secondary | ICD-10-CM

## 2017-05-29 DIAGNOSIS — Z0000 Encounter for general adult medical examination without abnormal findings: Secondary | ICD-10-CM | POA: Diagnosis not present

## 2017-05-29 DIAGNOSIS — R7309 Other abnormal glucose: Secondary | ICD-10-CM

## 2017-05-29 NOTE — Progress Notes (Signed)
     MRN: [id number] DOB: 07/07/1945  Subjective:   Victoria Mitchell is a 72 y.o. female presenting for follow up on Hypertension.  Patient was last seen on 05/15/17 for uncontrolled hypertension on labetalol 100 mg twice a day and clonidine 0.1 mg twice a day..  Amlodipine 2.68m was added to medication regimen.  She was instructed to follow-up in 2 weeks for reevaluation.  Today, she notes she has been taking her medication as prescribed.  She is tolerating medication well.  She has checked her blood pressure a few times at home and it is running 1160Vsystolically. Denies lightheadedness, dizziness, chronic headache, double vision, chest pain, shortness of breath, heart racing, palpitations, nausea, vomiting, abdominal pain, hematuria, lower leg swelling.  During last visit, TSH was decreased at 0.082.  She was encouraged to discontinue Synthroid 75 mcg for hypothyroidism at that time.  Patient notes she has not been taking this medication since she was contacted with this instruction.  Denies any other aggravating or relieving factors, no other questions or concerns.  [name] a current medication list which includes the following prescription(s): amlodipine, atorvastatin, baclofen, clonidine, labetalol, NONFORMULARY OR COMPOUNDED ITEM, and levothyroxine. Also has No Known Allergies.  [name] has a past medical history of Hypercholesterolemia, Hypertension, Hypokalemia, Stroke ([facility], and Thyroid disease. Also  has no past surgical history on file.   Objective:   Vitals: BP 132/80   Pulse 81   Temp 97.8 F (36.6 C) (Oral)   Resp 18   Ht _0  (1.6 m)   Wt 135 lb 12.8 oz (61.6 kg)   SpO2 96%   BMI 24.06 kg/m   Physical Exam  Constitutional: She is oriented to person, place, and time. She appears well-developed and well-nourished.  HENT:  Head: Normocephalic and atraumatic.  Eyes: Conjunctivae are normal.  Neck: Normal range of motion.  Cardiovascular: Normal rate, regular rhythm, normal heart  sounds and intact distal pulses.  Pulmonary/Chest: Effort normal.  Musculoskeletal:       Right upper arm: She exhibits deformity (right arm contracture noted).       Right lower leg: She exhibits no swelling.       Left lower leg: She exhibits no swelling.  Neurological: She is alert and oriented to person, place, and time.  Uses cane for assistance for ambulation.  Skin: Skin is warm and dry.  Psychiatric: She has a normal mood and affect.  Vitals reviewed.   No results found for this or any previous visit (from the past 24 hour(s)).  Assessment and Plan :  1. Essential hypertension Well-controlled.  Recommended continuing with current medication regimen.  Encouraged to continue checking blood pressure outside the office.  Goal is <140/90.  Plan to follow-up in 4 weeks for lab only visit to check CMP and TSH.  Plan to follow-up 6 months in office.  If patient runs out of any of her medications in the meantime, encouraged to call our office and request courtesy refills. - CMP14+EGFR; Future  2. Hypothyroidism, unspecified type We will follow-up in 4 weeks for lab only visit.  Depending on TSH result, will determine if we need to reinitiate thyroid medication.  In the meantime, continue holding Synthroid. - TSH; Future  3. Elevated glucose Labs pending. - Hemoglobin A1c; Future      BTenna Delaine PA-C  [facility]2/14/2019 4:23 PM

## 2017-05-29 NOTE — Patient Instructions (Addendum)
Victoria Mitchell , Thank you for taking time to come for your Medicare Wellness Visit. I appreciate your ongoing commitment to your health goals. Please review the following plan we discussed and let me know if I can assist you in the future.   Screening recommendations/referrals: Colonoscopy: declined Mammogram: declined Bone Density: declined Recommended yearly ophthalmology/optometry visit for glaucoma screening and checkup Recommended yearly dental visit for hygiene and checkup  Vaccinations: Influenza vaccine: declined Pneumococcal vaccine: declined Tdap vaccine: declined due to insurance  Shingles vaccine: declined   Advanced directives: Advance directive discussed with you today. Even though you declined this today please call our office should you change your mind and we can give you the proper paperwork for you to fill out.   Conditions/risks identified: You declined this   Next appointment: today @ 11:20 am with Tenna Delaine, next AWV in 1 year    Preventive Care 70 Years and Older, Female Preventive care refers to lifestyle choices and visits with your health care provider that can promote health and wellness. What does preventive care include?  A yearly physical exam. This is also called an annual well check.  Dental exams once or twice a year.  Routine eye exams. Ask your health care provider how often you should have your eyes checked.  Personal lifestyle choices, including:  Daily care of your teeth and gums.  Regular physical activity.  Eating a healthy diet.  Avoiding tobacco and drug use.  Limiting alcohol use.  Practicing safe sex.  Taking low-dose aspirin every day.  Taking vitamin and mineral supplements as recommended by your health care provider. What happens during an annual well check? The services and screenings done by your health care provider during your annual well check will depend on your age, overall health, lifestyle risk factors, and  family history of disease. Counseling  Your health care provider may ask you questions about your:  Alcohol use.  Tobacco use.  Drug use.  Emotional well-being.  Home and relationship well-being.  Sexual activity.  Eating habits.  History of falls.  Memory and ability to understand (cognition).  Work and work Statistician.  Reproductive health. Screening  You may have the following tests or measurements:  Height, weight, and BMI.  Blood pressure.  Lipid and cholesterol levels. These may be checked every 5 years, or more frequently if you are over 5 years old.  Skin check.  Lung cancer screening. You may have this screening every year starting at age 66 if you have a 30-pack-year history of smoking and currently smoke or have quit within the past 15 years.  Fecal occult blood test (FOBT) of the stool. You may have this test every year starting at age 70.  Flexible sigmoidoscopy or colonoscopy. You may have a sigmoidoscopy every 5 years or a colonoscopy every 10 years starting at age 70.  Hepatitis C blood test.  Hepatitis B blood test.  Sexually transmitted disease (STD) testing.  Diabetes screening. This is done by checking your blood sugar (glucose) after you have not eaten for a while (fasting). You may have this done every 1-3 years.  Bone density scan. This is done to screen for osteoporosis. You may have this done starting at age 70.  Mammogram. This may be done every 1-2 years. Talk to your health care provider about how often you should have regular mammograms. Talk with your health care provider about your test results, treatment options, and if necessary, the need for more tests. Vaccines  Your  health care provider may recommend certain vaccines, such as:  Influenza vaccine. This is recommended every year.  Tetanus, diphtheria, and acellular pertussis (Tdap, Td) vaccine. You may need a Td booster every 10 years.  Zoster vaccine. You may need this  after age 70.  Pneumococcal 13-valent conjugate (PCV13) vaccine. One dose is recommended after age 70.  Pneumococcal polysaccharide (PPSV23) vaccine. One dose is recommended after age 66. Talk to your health care provider about which screenings and vaccines you need and how often you need them. This information is not intended to replace advice given to you by your health care provider. Make sure you discuss any questions you have with your health care provider. Document Released: 04/28/2015 Document Revised: 12/20/2015 Document Reviewed: 01/31/2015 Elsevier Interactive Patient Education  2017 [place] Prevention in the Home Falls can cause injuries. They can happen to people of all ages. There are many things you can do to make your home safe and to help prevent falls. What can I do on the outside of my home?  Regularly fix the edges of walkways and driveways and fix any cracks.  Remove anything that might make you trip as you walk through a door, such as a raised step or threshold.  Trim any bushes or trees on the path to your home.  Use bright outdoor lighting.  Clear any walking paths of anything that might make someone trip, such as rocks or tools.  Regularly check to see if handrails are loose or broken. Make sure that both sides of any steps have handrails.  Any raised decks and porches should have guardrails on the edges.  Have any leaves, snow, or ice cleared regularly.  Use sand or salt on walking paths during winter.  Clean up any spills in your garage right away. This includes oil or grease spills. What can I do in the bathroom?  Use night lights.  Install grab bars by the toilet and in the tub and shower. Do not use towel bars as grab bars.  Use non-skid mats or decals in the tub or shower.  If you need to sit down in the shower, use a plastic, non-slip stool.  Keep the floor dry. Clean up any water that spills on the floor as soon as it  happens.  Remove soap buildup in the tub or shower regularly.  Attach bath mats securely with double-sided non-slip rug tape.  Do not have throw rugs and other things on the floor that can make you trip. What can I do in the bedroom?  Use night lights.  Make sure that you have a light by your bed that is easy to reach.  Do not use any sheets or blankets that are too big for your bed. They should not hang down onto the floor.  Have a firm chair that has side arms. You can use this for support while you get dressed.  Do not have throw rugs and other things on the floor that can make you trip. What can I do in the kitchen?  Clean up any spills right away.  Avoid walking on wet floors.  Keep items that you use a lot in easy-to-reach places.  If you need to reach something above you, use a strong step stool that has a grab bar.  Keep electrical cords out of the way.  Do not use floor polish or wax that makes floors slippery. If you must use wax, use non-skid floor wax.  Do not  have throw rugs and other things on the floor that can make you trip. What can I do with my stairs?  Do not leave any items on the stairs.  Make sure that there are handrails on both sides of the stairs and use them. Fix handrails that are broken or loose. Make sure that handrails are as long as the stairways.  Check any carpeting to make sure that it is firmly attached to the stairs. Fix any carpet that is loose or worn.  Avoid having throw rugs at the top or bottom of the stairs. If you do have throw rugs, attach them to the floor with carpet tape.  Make sure that you have a light switch at the top of the stairs and the bottom of the stairs. If you do not have them, ask someone to add them for you. What else can I do to help prevent falls?  Wear shoes that:  Do not have high heels.  Have rubber bottoms.  Are comfortable and fit you well.  Are closed at the toe. Do not wear sandals.  If you  use a stepladder:  Make sure that it is fully opened. Do not climb a closed stepladder.  Make sure that both sides of the stepladder are locked into place.  Ask someone to hold it for you, if possible.  Clearly mark and make sure that you can see:  Any grab bars or handrails.  First and last steps.  Where the edge of each step is.  Use tools that help you move around (mobility aids) if they are needed. These include:  Canes.  Walkers.  Scooters.  Crutches.  Turn on the lights when you go into a dark area. Replace any light bulbs as soon as they burn out.  Set up your furniture so you have a clear path. Avoid moving your furniture around.  If any of your floors are uneven, fix them.  If there are any pets around you, be aware of where they are.  Review your medicines with your doctor. Some medicines can make you feel dizzy. This can increase your chance of falling. Ask your doctor what other things that you can do to help prevent falls. This information is not intended to replace advice given to you by your health care provider. Make sure you discuss any questions you have with your health care provider. Document Released: 01/26/2009 Document Revised: 09/07/2015 Document Reviewed: 05/06/2014 Elsevier Interactive Patient Education  2017 [employer].

## 2017-05-29 NOTE — Patient Instructions (Addendum)
Continue with current blood pressure medication regimen. Follow up in 4 weeks for lab only visit repeat lab work. Thank you for letting me participate in your health and well being. Follow up in office in 6 months.      IF you received an x-ray today, you will receive an invoice from [facility]. Please contact [facility] at [phone] with questions or concerns regarding your invoice.   IF you received labwork today, you will receive an invoice from [facility]. Please contact [facility] at [phone] with questions or concerns regarding your invoice.   Our billing staff will not be able to assist you with questions regarding bills from these companies.  You will be contacted with the lab results as soon as they are available. The fastest way to get your results is to activate your My Chart account. Instructions are located on the last page of this paperwork. If you have not heard from [place] regarding the results in 2 weeks, please contact this office.

## 2017-05-29 NOTE — Progress Notes (Signed)
Subjective:   Victoria Mitchell is a 72 y.o. female who presents for an Initial Medicare Annual Wellness Visit.  Review of Systems    N/A  Cardiac Risk Factors include: advanced age (>52men, >42 women);dyslipidemia;hypertension     Objective:    Today's Vitals   05/29/17 1045  BP: 132/80  Pulse: 81  SpO2: 96%  Weight: 135 lb 8 oz (61.5 kg)  Height: 5\' 3"  (1.6 m)   Body mass index is 24 kg/m.  Advanced Directives 05/29/2017 10/21/2016  Does Patient Have a Medical Advance Directive? No No  Would patient like information on creating a medical advance directive? No - Patient declined No - Patient declined    Current Medications (verified) Outpatient Encounter Medications as of 05/29/2017  Medication Sig  . amLODipine (NORVASC) 2.5 MG tablet Take 1 tablet (2.5 mg total) by mouth daily.  Marland Kitchen atorvastatin (LIPITOR) 10 MG tablet Take 1 tablet (10 mg total) by mouth daily.  . baclofen (LIORESAL) 10 MG tablet Take 1 tablet (10 mg total) by mouth 3 (three) times daily. PLEASE RETURN TO OFFICE FOR ADDITIONAL REFILLS.  . cloNIDine (CATAPRES) 0.2 MG tablet TAKE 1/2 (ONE-HALF) TABLET BY MOUTH TWICE DAILY  . labetalol (NORMODYNE) 100 MG tablet Take 1 tablet (100 mg total) by mouth 2 (two) times daily.  . NONFORMULARY OR COMPOUNDED ITEM Apply 1 Units topically once a week.  . levothyroxine (SYNTHROID, LEVOTHROID) 75 MCG tablet TAKE ONE TABLET BY MOUTH ONCE DAILY BEFORE BREAKFAST (Patient not taking: Reported on 05/29/2017)   No facility-administered encounter medications on file as of 05/29/2017.     Allergies (verified) Patient has no known allergies.   History: Past Medical History:  Diagnosis Date  . Hypercholesterolemia   . Hypertension   . Hypokalemia   . Stroke ([place])   . Thyroid disease    hypothyroid   History reviewed. No pertinent surgical history. Family History  Problem Relation Age of Onset  . Diabetes Father   . Hypertension Brother   . Heart disease Brother   . Heart  attack Brother   . Heart disease Brother   . Hypertension Brother    Social History   Socioeconomic History  . Marital status: Married    Spouse name: Eduard Clos  . Number of children: 0  . Years of education: None  . Highest education level: 11th grade  Social Needs  . Financial resource strain: Not hard at all  . Food insecurity - worry: Never true  . Food insecurity - inability: Never true  . Transportation needs - medical: No  . Transportation needs - non-medical: No  Occupational History  . None  Tobacco Use  . Smoking status: Former Smoker    Packs/day: 0.50    Years: 30.00    Pack years: 15.00  . Smokeless tobacco: Never Used  Substance and Sexual Activity  . Alcohol use: No  . Drug use: No  . Sexual activity: Yes  Other Topics Concern  . None  Social History Narrative  . None    Tobacco Counseling Counseling given: Not Answered   Clinical Intake:  Pre-visit preparation completed: Yes  Pain : No/denies pain     Nutritional Status: BMI of 19-24  Normal Nutritional Risks: None Diabetes: No  How often do you need to have someone help you when you read instructions, pamphlets, or other written materials from your doctor or pharmacy?: 1 - Never What is the last grade level you completed in school?: 11th grade  Interpreter Needed?:  No  Information entered by :: Andrez Grime, LPN   Activities of Daily Living In your present state of health, do you have any difficulty performing the following activities: 05/29/2017 10/21/2016  Hearing? N N  Vision? N N  Difficulty concentrating or making decisions? N N  Walking or climbing stairs? N Y  Dressing or bathing? N N  Doing errands, shopping? N N  Preparing Food and eating ? N -  Using the Toilet? N -  In the past six months, have you accidently leaked urine? N -  Do you have problems with loss of bowel control? N -  Managing your Medications? N -  Managing your Finances? N -  Housekeeping or managing  your Housekeeping? N -  Some recent data might be hidden     Immunizations and Health Maintenance Immunization History  Administered Date(s) Administered  . H1N1 09/08/2008  . Influenza Whole 05/14/2007, 01/27/2008  . Pneumococcal Polysaccharide-23 01/27/2008  . Td 05/14/2007   There are no preventive care reminders to display for this patient.  Patient Care Team: Donzetta Kohut as PCP - General (Physician Assistant)  Indicate any recent Medical Services you may have received from other than [facility] providers in the past year (date may be approximate).     Assessment:   This is a routine wellness examination for Victoria Mitchell.  Hearing/Vision screen Hearing Screening Comments: Patient has never had a hearing exam.  Vision Screening Comments: Patient goes to [facility] for routine eye exams every 1-2 years.   Dietary issues and exercise activities discussed: Current Exercise Habits: The patient does not participate in regular exercise at present, Exercise limited by: None identified  Goals    . declined     Patient declined healthcare goal      Depression Screen PHQ 2/9 Scores 05/29/2017 05/15/2017 10/21/2016 03/15/2016 02/15/2016  PHQ - 2 Score 0 0 0 0 0    Fall Risk Fall Risk  05/29/2017 05/15/2017 10/21/2016 03/15/2016 02/15/2016  Falls in the past year? No Yes No No No  Number falls in past yr: - 1 - - -  Injury with Fall? - No - - -    Is the patient's home free of loose throw rugs in walkways, pet beds, electrical cords, etc?   yes      Grab bars in the bathroom? yes      Handrails on the stairs?   yes      Adequate lighting?   yes  Timed Get Up and Go Performed yes, Patient has to use a walker due to unsteady gait. Completed within 30 seconds   Cognitive Function:     6CIT Screen 05/29/2017  What Year? 0 points  What month? 0 points  What time? 0 points  Count back from 20 0 points  Months in reverse 0 points  Repeat phrase 2 points  Total Score 2     Screening Tests Health Maintenance  Topic Date Due  . TETANUS/TDAP  05/29/2017 (Originally 05/13/2017)  . PNA vac Low Risk Adult (1 of 2 - PCV13) 05/29/2017 (Originally 11/23/2012)  . MAMMOGRAM  11/12/2017 (Originally 06/22/2011)  . COLONOSCOPY  11/12/2017 (Originally 11/23/1997)  . Hepatitis C Screening  11/12/2017 (Originally Apr 06, 1946)  . INFLUENZA VACCINE  01/15/2018 (Originally 11/13/2016)  . DEXA SCAN  Completed    Qualifies for Shingles Vaccine? Patient declined   Cancer Screenings: Lung: Low Dose CT Chest recommended if Age 9-80 years, 30 pack-year currently smoking OR have quit w/in 15years. Patient  does not qualify. Breast: Up to date on Mammogram? Patient declined   Up to date of Bone Density/Dexa? Patient declined  Colorectal: Patient declined  Additional Screenings:  Hepatitis B/HIV/Syphillis: not indicated  Hepatitis C Screening: Patient declined   Patient declined pneumonia, flu, tetanus and shingles vaccine    Plan:   I have personally reviewed and noted the following in the patient's chart:   . Medical and social history . Use of alcohol, tobacco or illicit drugs  . Current medications and supplements . Functional ability and status . Nutritional status . Physical activity . Advanced directives . List of other physicians . Hospitalizations, surgeries, and ER visits in previous 12 months . Vitals . Screenings to include cognitive, depression, and falls . Referrals and appointments  In addition, I have reviewed and discussed with patient certain preventive protocols, quality metrics, and best practice recommendations. A written personalized care plan for preventive services as well as general preventive health recommendations were provided to patient.   1. Encounter for Medicare annual wellness exam  Andrez Grime, LPN   1/75/1025

## 2017-08-24 ENCOUNTER — Other Ambulatory Visit: Payer: Self-pay | Admitting: Physician Assistant

## 2017-08-24 DIAGNOSIS — M62838 Other muscle spasm: Secondary | ICD-10-CM

## 2017-11-12 NOTE — Progress Notes (Signed)
MRN: [id number] DOB: 1946-03-28  Subjective:   Victoria Mitchell is a 72 y.o. female presenting for follow up on chronic medical conditions.   1) HTN: Has been out of all bp medications for at least 3 days. Managed with labetalol 100 mg twice a day,  clonidine 0.1 mg twice a day, and amlodipine 2.'5mg'$ . Patient is checking blood pressure at home while on medication, range is 062-376E systolic.  Denies lightheadedness, dizziness, chronic headache, double vision, chest pain, shortness of breath, heart racing, palpitations, nausea, vomiting, abdominal pain, hematuria, lower leg swelling. Former smoker.   2) Hypothyroidism: Last TSH in 04/2017 was low at 0.082. D/c synthroid 67mg at that time. Was supposed to follow up for repeat TSH but did not. Today, denies dry skin, fatigue, weight gain, hair thinning, and constipation.  3) HLD: Controlled on lipitor '10mg'$ .   4) Muscle Spasms: Managed with baclofen '10mg'$  TID as needed for muscle spasms. 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  Has not taken aspirin in quite some time.    [name] a current medication list which includes the following prescription(s): amlodipine, atorvastatin, baclofen, clonidine, labetalol, levothyroxine, and NONFORMULARY OR COMPOUNDED ITEM. Also has No Known Allergies.  [name] has a past medical history of Hypercholesterolemia, Hypertension, Hypokalemia, Stroke ([place], and Thyroid disease. Also  has no past surgical history on file.   Social History   Socioeconomic History  . Marital status: Married    Spouse name: [name] . Number of children: 0  . Years of education: Not on file  . Highest education level: 11th grade  Occupational History  . Not on file  Social Needs  . Financial resource strain: Not hard at all  . Food insecurity:    Worry: Never true    Inability: Never true  . Transportation needs:    Medical: No    Non-medical: No    Tobacco Use  . Smoking status: Former Smoker    Packs/day: 0.50    Years: 30.00    Pack years: 15.00  . Smokeless tobacco: Never Used  Substance and Sexual Activity  . Alcohol use: No  . Drug use: No  . Sexual activity: Yes  Lifestyle  . Physical activity:    Days per week: 0 days    Minutes per session: 0 min  . Stress: Not at all  Relationships  . Social connections:    Talks on phone: More than three times a week    Gets together: More than three times a week    Attends religious service: Never    Active member of club or organization: No    Attends meetings of clubs or organizations: Never    Relationship status: Married  . Intimate partner violence:    Fear of current or ex partner: No    Emotionally abused: No    Physically abused: No    Forced sexual activity: No  Other Topics Concern  . Not on file  Social History Narrative  . Not on file      Objective:   Vitals: BP (!) 169/93   Pulse 84   Temp 99.2 F (37.3 C) (Oral)   Resp 20   Ht 5' 2.8" (1.595 m)   Wt 130 lb 6.4 oz (59.1 kg)   SpO2 97%   BMI 23.25 kg/m   Physical Exam  Constitutional: She is oriented to person, place,  and time. She appears well-developed and well-nourished. No distress.  HENT:  Head: Normocephalic and atraumatic.  Mouth/Throat: Uvula is midline, oropharynx is clear and moist and mucous membranes are normal.  Eyes: Pupils are equal, round, and reactive to light. Conjunctivae and EOM are normal.  Neck: Normal range of motion.  Cardiovascular: Normal rate, regular rhythm, normal heart sounds and intact distal pulses.  Pulmonary/Chest: Effort normal and breath sounds normal. She has no wheezes. She has no rhonchi. She has no rales.  Musculoskeletal:       Right upper arm: She exhibits deformity ( right arm contracture noted).       Right lower leg: She exhibits no swelling.       Left lower leg: She exhibits no swelling.  Neurological: She is alert and oriented to person,  place, and time.  Uses cane as walking assistive device  Skin: Skin is warm and dry.  Psychiatric: She has a normal mood and affect.  Vitals reviewed.  No results found for this or any previous visit (from the past 24 hour(s)).   BP Readings from Last 3 Encounters:  11/13/17 (!) 169/93  05/29/17 132/80  05/29/17 132/80   [name].   Assessment and Plan :  1. Essential hypertension Uncontrolled in office. Likely due to being out of all bp medication for past 3 days. She is asx. No acute findings noted on PE. Rec restarting medication and f/u for bp reading in 2 weeks.Plan to collect urine at that visit as she could not provide a sample today. As long as bp it at goal, ~130/80, can follow up in office in 6 months. Given strict ED precautions.  - CBC with Differential/Platelet - CMP14+EGFR - Urinalysis, dipstick only - labetalol (NORMODYNE) 100 MG tablet; Take 1 tablet (100 mg total) by mouth 2 (two) times daily.  Dispense: 180 tablet; Refill: 1 - cloNIDine (CATAPRES) 0.2 MG tablet; TAKE 1/2 (ONE-HALF) TABLET BY MOUTH TWICE DAILY  Dispense: 90 tablet; Refill: 1 - amLODipine (NORVASC) 2.5 MG tablet; Take 1 tablet (2.5 mg total) by mouth daily.  Dispense: 90 tablet; Refill: 1 - POCT Microscopic Urinalysis ([facility])  2. Elevated glucose Lab pending. - Hemoglobin A1c  3. Mixed hyperlipidemia - atorvastatin (LIPITOR) 10 MG tablet; Take 1 tablet (10 mg total) by mouth daily.  Dispense: 90 tablet; Refill: 3  4. Hypothyroidism, unspecified type Labs pending. May need to reinitiate synthroid depending on results.  - TSH - T3, Free - T4, Free  5. Muscle spasm of right lower extremity - baclofen (LIORESAL) 10 MG tablet; Take 1 tablet (10 mg total) by mouth 3 (three) times daily.  Dispense: 90 tablet; Refill: 1  6. History of stroke Start aspirin '81mg'$  daily.   7. Screening for breast cancer - MM DIGITAL SCREENING BILATERAL; Future  DECLINES prevnar vaccine, tdap vaccine,  colonoscopy, and DEXA scan.   Tenna Delaine, PA-C  Primary Care at [facility] 11/13/2017 3:50 PM

## 2017-11-13 ENCOUNTER — Encounter: Payer: Self-pay | Admitting: Physician Assistant

## 2017-11-13 ENCOUNTER — Ambulatory Visit (INDEPENDENT_AMBULATORY_CARE_PROVIDER_SITE_OTHER): Payer: Medicare HMO | Admitting: Physician Assistant

## 2017-11-13 ENCOUNTER — Other Ambulatory Visit: Payer: Self-pay

## 2017-11-13 VITALS — BP 169/93 | HR 84 | Temp 99.2°F | Resp 20 | Ht 62.8 in | Wt 130.4 lb

## 2017-11-13 DIAGNOSIS — E039 Hypothyroidism, unspecified: Secondary | ICD-10-CM | POA: Diagnosis not present

## 2017-11-13 DIAGNOSIS — Z1239 Encounter for other screening for malignant neoplasm of breast: Secondary | ICD-10-CM

## 2017-11-13 DIAGNOSIS — I10 Essential (primary) hypertension: Secondary | ICD-10-CM

## 2017-11-13 DIAGNOSIS — E782 Mixed hyperlipidemia: Secondary | ICD-10-CM

## 2017-11-13 DIAGNOSIS — M62838 Other muscle spasm: Secondary | ICD-10-CM

## 2017-11-13 DIAGNOSIS — Z1231 Encounter for screening mammogram for malignant neoplasm of breast: Secondary | ICD-10-CM | POA: Diagnosis not present

## 2017-11-13 DIAGNOSIS — R7309 Other abnormal glucose: Secondary | ICD-10-CM

## 2017-11-13 DIAGNOSIS — Z8673 Personal history of transient ischemic attack (TIA), and cerebral infarction without residual deficits: Secondary | ICD-10-CM | POA: Diagnosis not present

## 2017-11-13 MED ORDER — AMLODIPINE BESYLATE 2.5 MG PO TABS: 2.5000 mg | ORAL_TABLET | Freq: Every day | ORAL | 1 refills | Status: DC

## 2017-11-13 MED ORDER — LABETALOL HCL 100 MG PO TABS: 100.0000 mg | ORAL_TABLET | Freq: Two times a day (BID) | ORAL | 1 refills | Status: DC

## 2017-11-13 MED ORDER — ATORVASTATIN CALCIUM 10 MG PO TABS: 10.0000 mg | ORAL_TABLET | Freq: Every day | ORAL | 3 refills | Status: DC

## 2017-11-13 MED ORDER — BACLOFEN 10 MG PO TABS: 10.0000 mg | ORAL_TABLET | Freq: Three times a day (TID) | ORAL | 1 refills | Status: DC

## 2017-11-13 MED ORDER — CLONIDINE HCL 0.2 MG PO TABS: ORAL_TABLET | ORAL | 1 refills | Status: DC

## 2017-11-13 NOTE — Patient Instructions (Addendum)
In terms of elevated blood pressure, please restart all medication. Return here for nurse only visit to have blood pressure rechecked while on medication in 2 weeks so we can ensure this is within normal range on your medication. If you start to have chest pain, blurred vision, shortness of breath, severe headache, lower leg swelling, or nausea/vomiting please seek care immediately here or at the ED.   I also recommend adding daily aspirin 81mg  to your current medication regimen.  Follow up in office in 6 months.   Thank you for letting me participate in your health and well being.    IF you received an x-ray today, you will receive an invoice from [facility]. Please contact [facility] at [phone] with questions or concerns regarding your invoice.   IF you received labwork today, you will receive an invoice from [place]. Please contact [facility] at [phone] with questions or concerns regarding your invoice.   Our billing staff will not be able to assist you with questions regarding bills from these companies.  You will be contacted with the lab results as soon as they are available. The fastest way to get your results is to activate your My Chart account. Instructions are located on the last page of this paperwork. If you have not heard from [place] regarding the results in 2 weeks, please contact this office.

## 2017-11-14 LAB — CMP14+EGFR
ALT: 11 IU/L (ref 0–32)
AST: 21 IU/L (ref 0–40)
Albumin/Globulin Ratio: 1.1 — ABNORMAL LOW (ref 1.2–2.2)
Albumin: 4.3 g/dL (ref 3.6–4.8)
Alkaline Phosphatase: 83 IU/L (ref 39–117)
BILIRUBIN TOTAL: 0.7 mg/dL (ref 0.0–1.2)
BUN/Creatinine Ratio: 5 — ABNORMAL LOW (ref 12–28)
BUN: 7 mg/dL — ABNORMAL LOW (ref 8–27)
CALCIUM: 9.5 mg/dL (ref 8.7–10.3)
CHLORIDE: 103 mmol/L (ref 96–106)
CO2: 21 mmol/L (ref 20–29)
Creatinine, Ser: 1.29 mg/dL — ABNORMAL HIGH (ref 0.57–1.00)
GFR calc Af Amer: 49 mL/min/{1.73_m2} — ABNORMAL LOW (ref >59)
GFR, EST NON AFRICAN AMERICAN: 42 mL/min/{1.73_m2} — AB (ref >59)
Globulin, Total: 3.9 g/dL (ref 1.5–4.5)
Glucose: 115 mg/dL — ABNORMAL HIGH (ref 65–99)
POTASSIUM: 3.8 mmol/L (ref 3.5–5.2)
Sodium: 140 mmol/L (ref 134–144)
Total Protein: 8.2 g/dL (ref 6.0–8.5)

## 2017-11-14 LAB — CBC WITH DIFFERENTIAL/PLATELET
BASOS: 1 %
Basophils Absolute: 0.1 10*3/uL (ref 0.0–0.2)
EOS (ABSOLUTE): 0.2 10*3/uL (ref 0.0–0.4)
Eos: 2 %
Hematocrit: 41.3 % (ref 34.0–46.6)
Hemoglobin: 14.3 g/dL (ref 11.1–15.9)
IMMATURE GRANULOCYTES: 0 %
Immature Grans (Abs): 0 10*3/uL (ref 0.0–0.1)
Lymphocytes Absolute: 2 10*3/uL (ref 0.7–3.1)
Lymphs: 25 %
MCH: 31.7 pg (ref 26.6–33.0)
MCHC: 34.6 g/dL (ref 31.5–35.7)
MCV: 92 fL (ref 79–97)
MONOS ABS: 0.6 10*3/uL (ref 0.1–0.9)
Monocytes: 8 %
NEUTROS PCT: 64 %
Neutrophils Absolute: 5.1 10*3/uL (ref 1.4–7.0)
PLATELETS: 193 10*3/uL (ref 150–450)
RBC: 4.51 x10E6/uL (ref 3.77–5.28)
RDW: 14.4 % (ref 12.3–15.4)
WBC: 7.9 10*3/uL (ref 3.4–10.8)

## 2017-11-14 LAB — HEMOGLOBIN A1C
ESTIMATED AVERAGE GLUCOSE: 126 mg/dL
HEMOGLOBIN A1C: 6 % — AB (ref 4.8–5.6)

## 2017-11-14 LAB — TSH: TSH: 34.8 u[IU]/mL — AB (ref 0.450–4.500)

## 2017-11-14 LAB — T4, FREE: Free T4: 0.54 ng/dL — ABNORMAL LOW (ref 0.82–1.77)

## 2017-11-14 LAB — T3, FREE: T3, Free: 2.7 pg/mL (ref 2.0–4.4)

## 2017-11-17 ENCOUNTER — Other Ambulatory Visit: Payer: Self-pay | Admitting: Physician Assistant

## 2017-11-17 DIAGNOSIS — R7989 Other specified abnormal findings of blood chemistry: Secondary | ICD-10-CM

## 2017-11-17 MED ORDER — LEVOTHYROXINE SODIUM 25 MCG PO TABS: 25.0000 ug | ORAL_TABLET | Freq: Every day | ORAL | 0 refills | Status: DC

## 2017-11-17 NOTE — Progress Notes (Signed)
Please call pt. Her TSH was elevated. She needs to be back on synthroid for thyroid disease. I have sent this to her pharmacy. She will need TSH repeated in 6 weeks. Furthermore, her kidney function appears to have worsened since last visit. She needs to make sure she is not taking any over the counter antiinflammatory medication like iburprofen.She should also stop baclofen for the next few days until we reevaluate her labs as this can make kidney function worse.  She needs to be drinking at least 64 oz of water a day. She should return in 2 days for repeat BMP to ensure this not worsening.  We can recheck her blood pressure at this visit and also collect urine. Her A1C was also elevated putting her in the prediabetes range. She should try to consume a diet that emphasizes intake of vegetables, fruits, and whole grains; including low-fat dairy, poultry, fish (like salmon and tuna), beans, olive oil, and nuts. Limit the amount of sweet, sugar, and red meat in your diet.

## 2017-11-18 NOTE — Progress Notes (Signed)
Pt notified and verbalized understanding, she will come back in two days and then in 6 weeks after starting there thyroid medication

## 2017-11-20 ENCOUNTER — Ambulatory Visit: Payer: Medicare HMO

## 2017-11-20 DIAGNOSIS — I10 Essential (primary) hypertension: Secondary | ICD-10-CM

## 2017-11-20 DIAGNOSIS — R7989 Other specified abnormal findings of blood chemistry: Secondary | ICD-10-CM

## 2017-11-21 LAB — URINALYSIS, DIPSTICK ONLY
BILIRUBIN UA: NEGATIVE
GLUCOSE, UA: NEGATIVE
Ketones, UA: NEGATIVE
LEUKOCYTES UA: NEGATIVE
Nitrite, UA: NEGATIVE
PH UA: 5 (ref 5.0–7.5)
PROTEIN UA: NEGATIVE
RBC, UA: NEGATIVE
Specific Gravity, UA: 1.023 (ref 1.005–1.030)
Urobilinogen, Ur: 1 mg/dL (ref 0.2–1.0)

## 2017-11-21 LAB — URINALYSIS, MICROSCOPIC ONLY: Casts: NONE SEEN /lpf

## 2017-11-21 LAB — BASIC METABOLIC PANEL
BUN/Creatinine Ratio: 8 — ABNORMAL LOW (ref 12–28)
BUN: 9 mg/dL (ref 8–27)
CALCIUM: 9.3 mg/dL (ref 8.7–10.3)
CO2: 22 mmol/L (ref 20–29)
Chloride: 103 mmol/L (ref 96–106)
Creatinine, Ser: 1.17 mg/dL — ABNORMAL HIGH (ref 0.57–1.00)
GFR calc non Af Amer: 48 mL/min/{1.73_m2} — ABNORMAL LOW (ref >59)
GFR, EST AFRICAN AMERICAN: 55 mL/min/{1.73_m2} — AB (ref >59)
GLUCOSE: 110 mg/dL — AB (ref 65–99)
POTASSIUM: 3.6 mmol/L (ref 3.5–5.2)
Sodium: 139 mmol/L (ref 134–144)

## 2017-11-28 NOTE — Progress Notes (Signed)
MRN: [id number] DOB: 02-26-1946  Subjective:   Victoria Mitchell is a 72 y.o. female with PMH HTN, hypothyroidism, stroke, HLD, CKD stage II, presenting for follow up on lab work and hypertension.  Patient has been on her current medication regimen for years.  Does not remember what she has tried in the past.  Last visit was 11/13/2017.  BMP showed mildly worsening creatinine and GFR.  BP was elevated at 169/93.  Repeat BMP improved, but still elevated.  She is taking medications as prescribed.  She is having her sister check her blood pressure and reports readings have been in the 272Z to 366 systolically.  She is continue taking baclofen for her muscle cramps and contracture.  Denies any dysuria, dark urine, lightheadedness, dizziness, chronic headache, double vision, chest pain, shortness of breath, heart racing, palpitations, nausea, vomiting, abdominal pain, hematuria, lower leg swelling. PMH of gout, has not had flare up in years. Denies any other aggravating or relieving factors, no other questions or concerns.  Victoria Mitchell has a current medication list which includes the following prescription(s): amlodipine, atorvastatin, baclofen, clonidine, labetalol, levothyroxine, and losartan. Also has No Known Allergies.  Victoria Mitchell  has a past medical history of Hypercholesterolemia, Hypertension, Hypokalemia, Stroke ([place]), and Thyroid disease. Also  has no past surgical history on file.   Objective:   Vitals: BP (!) 154/70   Pulse 79   Temp 98.5 F (36.9 C) (Oral)   Resp 18   Ht 5' 2.8" (1.595 m)   Wt 128 lb 6.4 oz (58.2 kg)   SpO2 96%   BMI 22.89 kg/m   Physical Exam  Constitutional: She is oriented to person, place, and time. She appears well-developed and well-nourished.  HENT:  Head: Normocephalic and atraumatic.  Eyes: Conjunctivae are normal.  Neck: Normal range of motion.  Cardiovascular: Normal rate, regular rhythm, normal heart sounds and intact distal pulses.  Pulmonary/Chest: Effort  normal.  Musculoskeletal:       Right upper arm: She exhibits deformity (right arm contracture noted).       Right lower leg: She exhibits no swelling.       Left lower leg: She exhibits no swelling.  Neurological: She is alert and oriented to person, place, and time.  Uses cane for assistance for ambulation.  Skin: Skin is warm and dry.  Psychiatric: She has a normal mood and affect.  Vitals reviewed.  No results found for this or any previous visit (from the past 24 hour(s)). BP Readings from Last 3 Encounters:  11/29/17 (!) 154/70  11/13/17 (!) 169/93  05/29/17 132/80    Assessment and Plan :  1. Essential hypertension Improved since last visit but still elevated at 154/70.  She is asymptomatic.  See plan below. - losartan (COZAAR) 50 MG tablet; Take 1 tablet (50 mg total) by mouth daily.  Dispense: 90 tablet; Refill: 0 2. Elevated serum creatinine 3. Stage 2 chronic kidney disease Start ARB.  Continue amlodipine and labetalol.  Will slowly taper off clonidine over next 3 weeks. Will repeat BMP at f/u visit.  Given patient written out plan on how to taper clonidine safely.  Encouraged to check BP frequently while changing medications.  Educated that if after 2 weeks of tapering clonidine BP elevated greater than 140/90, and increase amlodipine from 2.5 mg to 5 mg daily.  Given strict return precautions.  At follow-up visit, plan to taper off labetalol.  Note - This record has been created using [employer].  Chart  creation errors have been sought, but may not always  have been located. Such creation errors do not reflect on  the standard of medical care.   Tenna Delaine, PA-C  Primary Care at [facility] 11/29/2017 4:19 PM

## 2017-11-29 ENCOUNTER — Encounter: Payer: Self-pay | Admitting: Physician Assistant

## 2017-11-29 ENCOUNTER — Other Ambulatory Visit: Payer: Self-pay

## 2017-11-29 ENCOUNTER — Ambulatory Visit (INDEPENDENT_AMBULATORY_CARE_PROVIDER_SITE_OTHER): Payer: Medicare HMO | Admitting: Physician Assistant

## 2017-11-29 VITALS — BP 154/70 | HR 79 | Temp 98.5°F | Resp 18 | Ht 62.8 in | Wt 128.4 lb

## 2017-11-29 DIAGNOSIS — N182 Chronic kidney disease, stage 2 (mild): Secondary | ICD-10-CM

## 2017-11-29 DIAGNOSIS — I10 Essential (primary) hypertension: Secondary | ICD-10-CM

## 2017-11-29 DIAGNOSIS — R7989 Other specified abnormal findings of blood chemistry: Secondary | ICD-10-CM

## 2017-11-29 MED ORDER — LOSARTAN POTASSIUM 50 MG PO TABS: 50.0000 mg | ORAL_TABLET | Freq: Every day | ORAL | 0 refills | Status: DC

## 2017-11-29 NOTE — Patient Instructions (Addendum)
Start losartan 50mg  daily. The day you start this, decrease clonidine dose. Below is how you decrease clonidine. You will do this over the next 3 weeks.   Days 1-3:  Decrease clonidine half tablet once day.  Then take clonidine half tablet every other day x 2 doses Then take clonidine half tablet every 3 days x 2 doses Then ake clonidine 0.25 tablet every other day x 2 doses Then take clonidine 0.25 tablet every 3 days x 1 dose   If after two weeks, BP >140/90, increase amlodipine to 5mg  daily instead 2.5mg   Then make an appointment in 3 weeks. We are going to recheck labs at that time.  If your blood pressure is very elevated (I.e., >150/90) during this process, please make an appointment with me.   If you start to have chest pain, blurred vision, shortness of breath, severe headache, lower leg swelling, or nausea/vomiting please seek care immediately here or at the ED.     IF you received an x-ray today, you will receive an invoice from [facility]. Please contact [facility] at [phone] with questions or concerns regarding your invoice.   IF you received labwork today, you will receive an invoice from [place]. Please contact [facility] at [phone] with questions or concerns regarding your invoice.   Our billing staff will not be able to assist you with questions regarding bills from these companies.  You will be contacted with the lab results as soon as they are available. The fastest way to get your results is to activate your My Chart account. Instructions are located on the last page of this paperwork. If you have not heard from [place] regarding the results in 2 weeks, please contact this office.

## 2017-12-01 ENCOUNTER — Encounter: Payer: Self-pay | Admitting: Physician Assistant

## 2018-02-28 ENCOUNTER — Other Ambulatory Visit: Payer: Self-pay | Admitting: Physician Assistant

## 2018-02-28 DIAGNOSIS — I10 Essential (primary) hypertension: Secondary | ICD-10-CM

## 2018-03-02 NOTE — Telephone Encounter (Signed)
Patient scheduled an appointment for 03/11/18 for medication follow up. She would like enough medication to get her to that appt.

## 2018-03-02 NOTE — Telephone Encounter (Signed)
Attempted to call patient regarding her refill for losartan 50 mg tab. Per chart she needs an office visit for labs and refill medications.  No answer, left message for patient to give the office a call back and schedule an appointment.

## 2018-03-03 ENCOUNTER — Other Ambulatory Visit: Payer: Self-pay | Admitting: Physician Assistant

## 2018-03-03 DIAGNOSIS — I10 Essential (primary) hypertension: Secondary | ICD-10-CM

## 2018-03-04 ENCOUNTER — Telehealth: Payer: Self-pay | Admitting: Physician Assistant

## 2018-03-04 NOTE — Telephone Encounter (Signed)
Already refilled today

## 2018-03-04 NOTE — Telephone Encounter (Signed)
Copied from [place] [id number]. Topic: Quick Communication - Rx Refill/Question >> Mar 04, 2018  9:51 AM Victoria Mitchell wrote: Medication: amLODipine (NORVASC) 2.5 MG tablet  Has the patient contacted their pharmacy? Yes.   (Agent: If no, request that the patient contact the pharmacy for the refill.) (Agent: If yes, when and what did the pharmacy advise?)they haven't heard from provider  Preferred Pharmacy (with phone number or street name): [place] [facility], [place] [phone] (Phone) [phone] (Fax)    Agent: Please be advised that RX refills may take up to 3 business days. We ask that you follow-up with your pharmacy.

## 2018-03-04 NOTE — Telephone Encounter (Signed)
Call to pharmacy to check and see if patient has refills- per pharmacy- patient does have refills. They states we need to check with patient and see if she has increased her dosing to 2 pills per day. Call to patient- she is taking 2 pills/day. Told her to call the pharmacy and get her refill. She has an appointment in 7 days with PCP- please let him know how she is taking her medication so her Rx can be corrected and updated.  Rx can be refused- but wanted to make PCP aware of call.

## 2018-03-11 ENCOUNTER — Ambulatory Visit (INDEPENDENT_AMBULATORY_CARE_PROVIDER_SITE_OTHER): Payer: Medicare HMO | Admitting: Family Medicine

## 2018-03-11 ENCOUNTER — Other Ambulatory Visit: Payer: Self-pay

## 2018-03-11 ENCOUNTER — Encounter: Payer: Self-pay | Admitting: Family Medicine

## 2018-03-11 ENCOUNTER — Other Ambulatory Visit: Payer: Self-pay | Admitting: Family Medicine

## 2018-03-11 VITALS — BP 138/75 | HR 81 | Temp 97.7°F | Resp 20 | Ht 62.99 in | Wt 130.4 lb

## 2018-03-11 DIAGNOSIS — N189 Chronic kidney disease, unspecified: Secondary | ICD-10-CM | POA: Diagnosis not present

## 2018-03-11 DIAGNOSIS — E039 Hypothyroidism, unspecified: Secondary | ICD-10-CM | POA: Diagnosis not present

## 2018-03-11 DIAGNOSIS — I10 Essential (primary) hypertension: Secondary | ICD-10-CM

## 2018-03-11 MED ORDER — AMLODIPINE BESYLATE 5 MG PO TABS: 5.0000 mg | ORAL_TABLET | Freq: Every day | ORAL | 1 refills | Status: DC

## 2018-03-11 MED ORDER — LEVOTHYROXINE SODIUM 25 MCG PO TABS: 25.0000 ug | ORAL_TABLET | Freq: Every day | ORAL | 1 refills | Status: DC

## 2018-03-11 NOTE — Progress Notes (Signed)
Subjective:  By signing my name below, I, Moises Blood, attest that this documentation has been prepared under the direction and in the presence of Merri Ray, MD. Electronically Signed: Moises Blood, [place]. 03/11/2018 , 11:01 AM .  Patient was seen in Room 10 .   Patient ID: Victoria Mitchell, female    DOB: 1946/03/23, 72 y.o.   MRN: [id number] Chief Complaint  Patient presents with  . Hypertension    Medication f/u - norvasc   HPI Victoria Mitchell is a 72 y.o. female Here for follow up of HTN. She is a new patient to me; previously seen by [place]; last visit in August. She has a history of multiple medical problems including HTN, hyperlipidemia, renal insufficiency, and hypothyroidism.   HTN BP Readings from Last 3 Encounters:  03/11/18 138/75  11/29/17 (!) 154/70  11/13/17 (!) 169/93   Lab Results  Component Value Date   CREATININE 1.17 (H) 11/20/2017   Se had reported home BP readings of 140s-150s at last visit. She was started on Losartan 50 mg qd with plan for taper for clonidine; instructions given at last visit. Also, possible taper off labetalol at future visit.   Patient states at last visit, her HTN regime was adjusted to losartan and taper off clonidine. She reports she's currently on 2 tablets of Norvasc a day, and no more clonidine. She is still taking labetalol bid. She denies any chest pain, shortness of breath, lightheadedness, dizziness, or headache.   CKD Lab Results  Component Value Date   CREATININE 1.17 (H) 11/20/2017   Range has been 1.04 to 1.29 since 2017; stable at last visit at 1.17.   Hypothyroidism Lab Results  Component Value Date   TSH 34.800 (H) 11/13/2017   She had an elevated TSH in August. She was restarted on synthroid 25 mcg. She states she's doing well; denies any heat/cold intolerance, unexpected weight changes, or skin/hair changes.   Hyperlipidemia Lab Results  Component Value Date   CHOL 117 05/15/2017   HDL 33 (L)  05/15/2017   LDLCALC 56 05/15/2017   TRIG 142 05/15/2017   CHOLHDL 3.5 05/15/2017   Lab Results  Component Value Date   ALT 11 11/13/2017   AST 21 11/13/2017   ALKPHOS 83 11/13/2017   BILITOT 0.7 11/13/2017   She takes Lipitor 10 mg qd.   Pre-diabetes Body mass index is 23.11 kg/m.   Wt Readings from Last 3 Encounters:  03/11/18 130 lb 6.4 oz (59.1 kg)  11/29/17 128 lb 6.4 oz (58.2 kg)  11/13/17 130 lb 6.4 oz (59.1 kg)   Lab Results  Component Value Date   HGBA1C 6.0 (H) 11/13/2017   Diet treatments were discussed.   Patient Active Problem List   Diagnosis Date Noted  . GOUT 01/10/2010  . 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 KNEE PAIN, RIGHT 12/19/2009  . BREAST MASS, LEFT 06/16/2009  . INSOMNIA 06/16/2009  . WART, VIRAL 12/22/2008  . OSTEOPENIA 11/02/2008  . Hypothyroidism 10/30/2008  . GOITER, MULTINODULAR 10/21/2008  . DYSPHAGIA UNSPECIFIED 10/21/2008  . POSTMENOPAUSAL STATUS 10/21/2008  . PALPITATIONS 09/02/2008  . EPIGASTRIC PAIN 09/02/2008  . EDEMA, ANKLES 09/11/2007  . RENAL INSUFFICIENCY 08/21/2007  . FIBROIDS, UTERUS 07/21/2007  . 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 Essential hypertension 05/14/2007  . ALLERGIC RHINITIS 05/14/2007  . HYPERTHYROIDISM, HX OF 04/02/2002  . STROKE, HX OF 09/13/1997   Past Medical History:  Diagnosis Date  . Hypercholesterolemia   . Hypertension   .  Hypokalemia   . Stroke ([place])   . Thyroid disease    hypothyroid   History reviewed. No pertinent surgical history. No Known Allergies Prior to Admission medications   Medication Sig Start Date End Date Taking? Authorizing Provider  amLODipine (NORVASC) 2.5 MG tablet TAKE 1 TABLET BY MOUTH ONCE DAILY 03/04/18   Tenna Delaine D, PA-C  atorvastatin (LIPITOR) 10 MG tablet Take 1 tablet (10 mg total) by mouth daily. 11/13/17   Tenna Delaine D, PA-C  baclofen (LIORESAL) 10 MG tablet Take 1 tablet (10 mg total) by mouth 3 (three) times daily. 11/13/17   Tenna Delaine D, PA-C  cloNIDine (CATAPRES) 0.2 MG tablet TAKE 1/2 (ONE-HALF) TABLET BY MOUTH TWICE DAILY 11/13/17   Tenna Delaine D, PA-C  labetalol (NORMODYNE) 100 MG tablet Take 1 tablet (100 mg total) by mouth 2 (two) times daily. 11/13/17   Tenna Delaine D, PA-C  levothyroxine (SYNTHROID, LEVOTHROID) 25 MCG tablet Take 1 tablet (25 mcg total) by mouth daily before breakfast. 11/17/17   Tenna Delaine D, PA-C  losartan (COZAAR) 50 MG tablet TAKE 1 TABLET BY MOUTH ONCE DAILY 03/03/18   Leonie Douglas, PA-C   Social History   Socioeconomic History  . Marital status: Married    Spouse name: Eduard Clos  . Number of children: 0  . Years of education: Not on file  . Highest education level: 11th grade  Occupational History  . Not on file  Social Needs  . Financial resource strain: Not hard at all  . Food insecurity:    Worry: Never true    Inability: Never true  . Transportation needs:    Medical: No    Non-medical: No  Tobacco Use  . Smoking status: Former Smoker    Packs/day: 0.50    Years: 30.00    Pack years: 15.00  . Smokeless tobacco: Never Used  Substance and Sexual Activity  . Alcohol use: No  . Drug use: No  . Sexual activity: Yes  Lifestyle  . Physical activity:    Days per week: 0 days    Minutes per session: 0 min  . Stress: Not at all  Relationships  . Social connections:    Talks on phone: More than three times a week    Gets together: More than three times a week    Attends religious service: Never    Active member of club or organization: No    Attends meetings of clubs or organizations: Never    Relationship status: Married  . Intimate partner violence:    Fear of current or ex partner: No    Emotionally abused: No    Physically abused: No    Forced sexual activity: No  Other Topics Concern  . Not on file  Social History Narrative  . Not on file   Review of Systems  Constitutional: Negative for activity change, appetite change, chills,  fatigue, fever and unexpected weight change.  Respiratory: Negative for chest tightness and shortness of breath.   Cardiovascular: Negative for chest pain, palpitations and leg swelling.  Gastrointestinal: Negative for abdominal pain and blood in stool.  Endocrine: Negative for cold intolerance and heat intolerance.  Neurological: Negative for dizziness, syncope, light-headedness and headaches.       Objective:   Physical Exam  Constitutional: She is oriented to person, place, and time. She appears well-developed and well-nourished.  HENT:  Head: Normocephalic and atraumatic.  Eyes: Pupils are equal, round, and reactive to light. Conjunctivae and EOM are  normal.  Neck: Carotid bruit is not present.  Cardiovascular: Normal rate, regular rhythm, normal heart sounds and intact distal pulses.  Pulmonary/Chest: Effort normal and breath sounds normal.  Abdominal: Soft. She exhibits no pulsatile midline mass. There is no tenderness.  Neurological: She is alert and oriented to person, place, and time.  Skin: Skin is warm and dry.  Psychiatric: She has a normal mood and affect. Her behavior is normal.  Vitals reviewed.   Vitals:   03/11/18 1031  BP: 138/75  Pulse: 81  Resp: 20  Temp: 97.7 F (36.5 C)  TempSrc: Oral  SpO2: 96%  Weight: 130 lb 6.4 oz (59.1 kg)  Height: 5' 2.99" (1.6 m)       Assessment & Plan:   Victoria Mitchell is a 72 y.o. female Hypothyroidism, unspecified type - Plan: TSH + free T4, levothyroxine (SYNTHROID, LEVOTHROID) 25 MCG tablet  -Check levels since restarting medication.  Continue Synthroid same dose for now  Essential hypertension - Plan: Basic metabolic panel, amLODipine (NORVASC) 5 MG tablet Chronic kidney disease, unspecified CKD stage  -Stable on current dose of losartan and Norvasc.   - Repeat BMP to check creatinine with starting losartan.    -Follow-up in 3 months with new primary care provider, and can discuss labetalol tapering at that time.  No  further changes at this time  Meds ordered this encounter  Medications  . amLODipine (NORVASC) 5 MG tablet    Sig: Take 1 tablet (5 mg total) by mouth daily.    Dispense:  90 tablet    Refill:  1  . levothyroxine (SYNTHROID, LEVOTHROID) 25 MCG tablet    Sig: Take 1 tablet (25 mcg total) by mouth daily before breakfast.    Dispense:  90 tablet    Refill:  1   Patient Instructions    I sent the new prescription for Norvasc 5 mg to pharmacy, 1 pill once per day.  No other medication changes.  Depending on thyroid level can decide if other changes needed but continue same dose for now.  Please follow-up with new primary care provider in the next 3 months.  Thank you for coming in today.  If you have lab work done today you will be contacted with your lab results within the next 2 weeks.  If you have not heard from [place] then please contact us. The fastest way to get your results is to register for My Chart.   IF you received an x-ray today, you will receive an invoice from [facility]. Please contact [facility] at [phone] with questions or concerns regarding your invoice.   IF you received labwork today, you will receive an invoice from [place]. Please contact [facility] at [phone] with questions or concerns regarding your invoice.   Our billing staff will not be able to assist you with questions regarding bills from these companies.  You will be contacted with the lab results as soon as they are available. The fastest way to get your results is to activate your My Chart account. Instructions are located on the last page of this paperwork. If you have not heard from [place] regarding the results in 2 weeks, please contact this office.      I personally performed the services described in this documentation, which was scribed in my presence. The recorded information has been reviewed and considered for accuracy and completeness, addended by me as needed, and agree with  information above.  Signed,   Merri Ray, MD  [facility] at [facility].  03/11/18 3:06 PM

## 2018-03-11 NOTE — Telephone Encounter (Signed)
Pharmacy is calling to verify increase in dose of medication - per chart note this is correct- sent to office for verification.

## 2018-03-11 NOTE — Telephone Encounter (Signed)
Alert to check Rx came up- note is finished and clear now- call to pharmacy and clarified that the increase is correct so Rx can be filled for patient.

## 2018-03-11 NOTE — Patient Instructions (Addendum)
  I sent the new prescription for Norvasc 5 mg to pharmacy, 1 pill once per day.  No other medication changes.  Depending on thyroid level can decide if other changes needed but continue same dose for now.  Please follow-up with new primary care provider in the next 3 months.  Thank you for coming in today.  If you have lab work done today you will be contacted with your lab results within the next 2 weeks.  If you have not heard from [place] then please contact us. The fastest way to get your results is to register for My Chart.   IF you received an x-ray today, you will receive an invoice from [facility]. Please contact [facility] at [phone] with questions or concerns regarding your invoice.   IF you received labwork today, you will receive an invoice from [place]. Please contact [facility] at [phone] with questions or concerns regarding your invoice.   Our billing staff will not be able to assist you with questions regarding bills from these companies.  You will be contacted with the lab results as soon as they are available. The fastest way to get your results is to activate your My Chart account. Instructions are located on the last page of this paperwork. If you have not heard from [place] regarding the results in 2 weeks, please contact this office.

## 2018-03-12 LAB — TSH+FREE T4
Free T4: 0.91 ng/dL (ref 0.82–1.77)
TSH: 10.29 u[IU]/mL — ABNORMAL HIGH (ref 0.450–4.500)

## 2018-03-12 LAB — BASIC METABOLIC PANEL
BUN/Creatinine Ratio: 7 — ABNORMAL LOW (ref 12–28)
BUN: 8 mg/dL (ref 8–27)
CALCIUM: 9.5 mg/dL (ref 8.7–10.3)
CHLORIDE: 106 mmol/L (ref 96–106)
CO2: 19 mmol/L — ABNORMAL LOW (ref 20–29)
Creatinine, Ser: 1.11 mg/dL — ABNORMAL HIGH (ref 0.57–1.00)
GFR calc Af Amer: 58 mL/min/{1.73_m2} — ABNORMAL LOW (ref >59)
GFR calc non Af Amer: 50 mL/min/{1.73_m2} — ABNORMAL LOW (ref >59)
Glucose: 106 mg/dL — ABNORMAL HIGH (ref 65–99)
POTASSIUM: 3.8 mmol/L (ref 3.5–5.2)
Sodium: 140 mmol/L (ref 134–144)

## 2018-03-29 ENCOUNTER — Other Ambulatory Visit: Payer: Self-pay | Admitting: Family Medicine

## 2018-03-29 DIAGNOSIS — E039 Hypothyroidism, unspecified: Secondary | ICD-10-CM

## 2018-03-29 NOTE — Progress Notes (Signed)
t

## 2018-04-02 ENCOUNTER — Telehealth: Payer: Self-pay | Admitting: Physician Assistant

## 2018-04-02 NOTE — Telephone Encounter (Signed)
Copied from [place] [id number]. Topic: Quick Communication - See Telephone Encounter >> Apr 02, 2018  5:04 PM Rutherford Nail, NT wrote: CRM for notification. See Telephone encounter for: 04/02/18. Patient returning a call to [place]. States that she has some questions regarding the results (from 03/11/18) that she just spoke with her husband about. Please advise.

## 2018-04-03 NOTE — Telephone Encounter (Signed)
Spoke with patient and she wanted to know was there anything she needed to do for her thyroid since it was elevated. Patient was once again informed to keep doing what she is doing and continue medication and f/u in two weeks for more blood work and if changes needed to be made it would be after next office visit

## 2018-04-20 ENCOUNTER — Other Ambulatory Visit: Payer: Self-pay | Admitting: Physician Assistant

## 2018-04-20 DIAGNOSIS — M62838 Other muscle spasm: Secondary | ICD-10-CM

## 2018-05-08 ENCOUNTER — Other Ambulatory Visit: Payer: Self-pay | Admitting: Physician Assistant

## 2018-05-08 DIAGNOSIS — I10 Essential (primary) hypertension: Secondary | ICD-10-CM

## 2018-05-08 NOTE — Telephone Encounter (Signed)
Requested Prescriptions  Pending Prescriptions Disp Refills  . labetalol (NORMODYNE) 100 MG tablet [Pharmacy Med Name: Labetalol HCl 100 MG Oral Tablet] 180 tablet 0    Sig: TAKE 1 TABLET BY MOUTH TWICE DAILY     Cardiovascular:  Beta Blockers Passed - 05/08/2018  1:06 PM      Passed - Last BP in normal range    BP Readings from Last 1 Encounters:  03/11/18 138/75         Passed - Last Heart Rate in normal range    Pulse Readings from Last 1 Encounters:  03/11/18 81         Passed - Valid encounter within last 6 months    Recent Outpatient Visits          1 month ago Hypothyroidism, unspecified type   [facility] at [place], Ranell Patrick, MD   5 months ago Essential hypertension   [facility] at [place], [place] D, PA-C   5 months ago Essential hypertension   [facility] at [place], [place] D, PA-C   11 months ago Essential hypertension   [facility] at [place], [place] D, PA-C   11 months ago Hypothyroidism, unspecified type   Primary Care at [facility], Reather Laurence, PA-C      Future Appointments            In 1 month Forrest Moron, MD Primary Care at [place], [facility]

## 2018-05-28 ENCOUNTER — Other Ambulatory Visit: Payer: Self-pay | Admitting: Physician Assistant

## 2018-05-28 DIAGNOSIS — I10 Essential (primary) hypertension: Secondary | ICD-10-CM

## 2018-05-28 NOTE — Telephone Encounter (Signed)
Requested Prescriptions  Pending Prescriptions Disp Refills  . losartan (COZAAR) 50 MG tablet [Pharmacy Med Name: Losartan Potassium 50 MG Oral Tablet] 90 tablet 0    Sig: TAKE 1 TABLET BY MOUTH ONCE DAILY     Cardiovascular:  Angiotensin Receptor Blockers Failed - 05/28/2018 12:57 PM      Failed - Cr in normal range and within 180 days    Creat  Date Value Ref Range Status  02/15/2016 1.14 (H) 0.50 - 0.99 mg/dL Final    Comment:      For patients > or = 71 years of age: The upper reference limit for Creatinine is approximately 13% higher for people identified as African-American.      Creatinine, Ser  Date Value Ref Range Status  03/11/2018 1.11 (H) 0.57 - 1.00 mg/dL Final         Passed - K in normal range and within 180 days    Potassium  Date Value Ref Range Status  03/11/2018 3.8 3.5 - 5.2 mmol/L Final         Passed - Patient is not pregnant      Passed - Last BP in normal range    BP Readings from Last 1 Encounters:  03/11/18 138/75         Passed - Valid encounter within last 6 months    Recent Outpatient Visits          2 months ago Hypothyroidism, unspecified type   [facility] at [place], MD   6 months ago Essential hypertension   [facility] at [place], [place] D, PA-C   6 months ago Essential hypertension   [facility] at [place], [place] D, PA-C   12 months ago Essential hypertension   [facility] at [place], [place] D, PA-C   1 year ago Hypothyroidism, unspecified type   Primary Care at [facility], Reather Laurence, PA-C      Future Appointments            In 2 weeks Forrest Moron, MD [facility] at [place], [facility]         Approved. Next OV scheduled for 06/11/18 with Dr. Nolon Rod.

## 2018-06-11 ENCOUNTER — Ambulatory Visit (INDEPENDENT_AMBULATORY_CARE_PROVIDER_SITE_OTHER): Payer: Medicare HMO | Admitting: Family Medicine

## 2018-06-11 ENCOUNTER — Other Ambulatory Visit: Payer: Self-pay

## 2018-06-11 ENCOUNTER — Encounter: Payer: Self-pay | Admitting: Family Medicine

## 2018-06-11 VITALS — BP 135/73 | HR 77 | Temp 97.6°F | Ht 62.0 in | Wt 133.0 lb

## 2018-06-11 DIAGNOSIS — E034 Atrophy of thyroid (acquired): Secondary | ICD-10-CM | POA: Diagnosis not present

## 2018-06-11 DIAGNOSIS — Z8673 Personal history of transient ischemic attack (TIA), and cerebral infarction without residual deficits: Secondary | ICD-10-CM

## 2018-06-11 DIAGNOSIS — N182 Chronic kidney disease, stage 2 (mild): Secondary | ICD-10-CM | POA: Diagnosis not present

## 2018-06-11 DIAGNOSIS — H6121 Impacted cerumen, right ear: Secondary | ICD-10-CM | POA: Diagnosis not present

## 2018-06-11 DIAGNOSIS — E782 Mixed hyperlipidemia: Secondary | ICD-10-CM | POA: Diagnosis not present

## 2018-06-11 DIAGNOSIS — I10 Essential (primary) hypertension: Secondary | ICD-10-CM

## 2018-06-11 DIAGNOSIS — M62838 Other muscle spasm: Secondary | ICD-10-CM | POA: Diagnosis not present

## 2018-06-11 MED ORDER — CARBAMIDE PEROXIDE 6.5 % OT SOLN: 5.0000 [drp] | Freq: Two times a day (BID) | OTIC | 0 refills | Status: DC

## 2018-06-11 NOTE — Patient Instructions (Addendum)
   If you have lab work done today you will be contacted with your lab results within the next 2 weeks.  If you have not heard from us then please contact us. The fastest way to get your results is to register for My Chart.   IF you received an x-ray today, you will receive an invoice from [facility]. Please contact [place] [facility] at [phone] with questions or concerns regarding your invoice.   IF you received labwork today, you will receive an invoice from [facility]. Please contact [facility] at [phone] with questions or concerns regarding your invoice.   Our billing staff will not be able to assist you with questions regarding bills from these companies.  You will be contacted with the lab results as soon as they are available. The fastest way to get your results is to activate your My Chart account. Instructions are located on the last page of this paperwork. If you have not heard from us regarding the results in 2 weeks, please contact this office.     Earwax Buildup, Adult The ears produce a substance called earwax that helps keep bacteria out of the ear and protects the skin in the ear canal. Occasionally, earwax can build up in the ear and cause discomfort or hearing loss. What increases the risk? This condition is more likely to develop in people who:  Are female.  Are elderly.  Naturally produce more earwax.  Clean their ears often with cotton swabs.  Use earplugs often.  Use in-ear headphones often.  Wear hearing aids.  Have narrow ear canals.  Have earwax that is overly thick or sticky.  Have eczema.  Are dehydrated.  Have excess hair in the ear canal. What are the signs or symptoms? Symptoms of this condition include:  Reduced or muffled hearing.  A feeling of fullness in the ear or feeling that the ear is plugged.  Fluid coming from the ear.  Ear pain.  Ear itch.  Ringing in the ear.  Coughing.  An obvious piece of earwax  that can be seen inside the ear canal. How is this diagnosed? This condition may be diagnosed based on:  Your symptoms.  Your medical history.  An ear exam. During the exam, your health care provider will look into your ear with an instrument called an otoscope. You may have tests, including a hearing test. How is this treated? This condition may be treated by:  Using ear drops to soften the earwax.  Having the earwax removed by a health care provider. The health care provider may: ? Flush the ear with water. ? Use an instrument that has a loop on the end (curette). ? Use a suction device.  Surgery to remove the wax buildup. This may be done in severe cases. Follow these instructions at home:   Take over-the-counter and prescription medicines only as told by your health care provider.  Do not put any objects, including cotton swabs, into your ear. You can clean the opening of your ear canal with a washcloth or facial tissue.  Follow instructions from your health care provider about cleaning your ears. Do not over-clean your ears.  Drink enough fluid to keep your urine clear or pale yellow. This will help to thin the earwax.  Keep all follow-up visits as told by your health care provider. If earwax builds up in your ears often or if you use hearing aids, consider seeing your health care provider for routine, preventive ear cleanings. Ask   Ask your health care provider how often you should schedule your cleanings.  If you have hearing aids, clean them according to instructions from the manufacturer and your health care provider. Contact a health care provider if:  You have ear pain.  You develop a fever.  You have blood, pus, or other fluid coming from your ear.  You have hearing loss.  You have ringing in your ears that does not go away.  Your symptoms do not improve with treatment.  You feel like the room is spinning (vertigo). Summary  Earwax can build up in the  ear and cause discomfort or hearing loss.  The most common symptoms of this condition include reduced or muffled hearing and a feeling of fullness in the ear or feeling that the ear is plugged.  This condition may be diagnosed based on your symptoms, your medical history, and an ear exam.  This condition may be treated by using ear drops to soften the earwax or by having the earwax removed by a health care provider.  Do not put any objects, including cotton swabs, into your ear. You can clean the opening of your ear canal with a washcloth or facial tissue. This information is not intended to replace advice given to you by your health care provider. Make sure you discuss any questions you have with your health care provider. Document Released: 05/09/2004 Document Revised: 03/13/2017 Document Reviewed: 06/12/2016 Elsevier Interactive Patient Education  2019 [employer].

## 2018-06-11 NOTE — Progress Notes (Signed)
Established Patient Office Visit  Subjective:  Patient ID: Victoria Mitchell, female    DOB: 1945-08-28  Age: 73 y.o. MRN: [id number]  CC:  Chief Complaint  Patient presents with  . Transitions Of Care    from [facility] and would like a f/u    HPI Victoria Mitchell presents for  Establish care with a new provider.  Is a former patient of [place], [place] who presents to establish care   History of a stroke and muscle spasticity  She is positive for history of stroke 20 years ago.  He now has residual right-sided weakness with muscle spasticity in the upper and lower extremity.  She denies depression.  No recent falls.  She says that she stumbles but has to use her 4 prong walker. She does not smoke   She is here to follow-up on her blood pressure her thyroid and she has complained of decreased hearing because of clogged sensation in her ears.  Hypertension: Patient here for follow-up of elevated blood pressure.  Patient does some chair exercises and avoid salty foods.  Her blood pressure is well controlled on her current medications.  And she is adherent and compliant.  She denies any chest pains, palpitations or shortness of breath.  She has no lower extremity edema.  She does not take any NSAIDs.  She has some mild kidney changes with an elevated creatinine on her previous labs. History of target organ damage: stroke.  Hypothyroidism: Patient presents for evaluation of thyroid function. Symptoms consist of denies fatigue, weight changes, heat/cold intolerance, bowel/skin changes or CVS symptoms.  She reports that she was diagnosed with hypothyroidism after her stroke.  She states that in the past year she was stopped on her thyroid medications and that it was restarted.  She denies any changes to her energy level.  Lab Results  Component Value Date   TSH 10.290 (H) 03/11/2018    Past Medical History:  Diagnosis Date  . Hypercholesterolemia   . Hypertension   . Hypokalemia   . Stroke  ([place])   . Thyroid disease    hypothyroid    No past surgical history on file.  Family History  Problem Relation Age of Onset  . Diabetes Father   . Hypertension Brother   . Heart disease Brother   . Heart attack Brother   . Heart disease Brother   . Hypertension Brother     Social History   Socioeconomic History  . Marital status: Married    Spouse name: Eduard Clos  . Number of children: 0  . Years of education: Not on file  . Highest education level: 11th grade  Occupational History  . Not on file  Social Needs  . Financial resource strain: Not hard at all  . Food insecurity:    Worry: Never true    Inability: Never true  . Transportation needs:    Medical: No    Non-medical: No  Tobacco Use  . Smoking status: Former Smoker    Packs/day: 0.50    Years: 30.00    Pack years: 15.00  . Smokeless tobacco: Never Used  Substance and Sexual Activity  . Alcohol use: No  . Drug use: No  . Sexual activity: Yes  Lifestyle  . Physical activity:    Days per week: 0 days    Minutes per session: 0 min  . Stress: Not at all  Relationships  . Social connections:    Talks on phone: More than three  times a week    Gets together: More than three times a week    Attends religious service: Never    Active member of club or organization: No    Attends meetings of clubs or organizations: Never    Relationship status: Married  . Intimate partner violence:    Fear of current or ex partner: No    Emotionally abused: No    Physically abused: No    Forced sexual activity: No  Other Topics Concern  . Not on file  Social History Narrative  . Not on file    Outpatient Medications Prior to Visit  Medication Sig Dispense Refill  . amLODipine (NORVASC) 5 MG tablet Take 1 tablet (5 mg total) by mouth daily. 90 tablet 1  . atorvastatin (LIPITOR) 10 MG tablet Take 1 tablet (10 mg total) by mouth daily. 90 tablet 3  . baclofen (LIORESAL) 10 MG tablet TAKE 1 TABLET BY MOUTH THREE TIMES  DAILY 90 each 0  . labetalol (NORMODYNE) 100 MG tablet TAKE 1 TABLET BY MOUTH TWICE DAILY 180 tablet 0  . levothyroxine (SYNTHROID, LEVOTHROID) 25 MCG tablet Take 1 tablet (25 mcg total) by mouth daily before breakfast. 90 tablet 1  . losartan (COZAAR) 50 MG tablet TAKE 1 TABLET BY MOUTH ONCE DAILY 90 tablet 0   No facility-administered medications prior to visit.     No Known Allergies  ROS Review of Systems See hpi Review of Systems  Constitutional: Negative for activity change, appetite change, chills and fever.  HENT: Negative for congestion, nosebleeds, trouble swallowing and voice change.   Respiratory: Negative for cough, shortness of breath and wheezing.   Gastrointestinal: Negative for diarrhea, nausea and vomiting.  Genitourinary: Negative for difficulty urinating, dysuria, flank pain and hematuria.  Musculoskeletal: Negative for back pain, joint swelling and neck pain.  Neurological: Negative for dizziness, speech difficulty, light-headedness and numbness.  See HPI. All other review of systems negative.     Objective:    Physical Exam  BP 135/73 (BP Location: Right Arm, Patient Position: Sitting, Cuff Size: Normal)   Pulse 77   Temp 97.6 F (36.4 C) (Oral)   Ht 5' 2" (1.575 m)   Wt 133 lb (60.3 kg)   SpO2 100%   BMI 24.33 kg/m  Wt Readings from Last 3 Encounters:  06/11/18 133 lb (60.3 kg)  03/11/18 130 lb 6.4 oz (59.1 kg)  11/29/17 128 lb 6.4 oz (58.2 kg)   Physical Exam  Constitutional: Oriented to person, place, and time. Appears well-developed and well-nourished.  HENT:  Head: Normocephalic and atraumatic.  Eyes: Conjunctivae and EOM are normal.  Ears: TM obscured due to cerumen impaction on the right, TM clear on the left Cardiovascular: Normal rate, regular rhythm, normal heart sounds and intact distal pulses.  No murmur heard. Pulmonary/Chest: Effort normal and breath sounds normal. No stridor. No respiratory distress. Has no wheezes.    Neurological: Is alert and oriented to person, place, and time.  Skin: Skin is warm. Capillary refill takes less than 2 seconds.  Psychiatric: Has a normal mood and affect. Behavior is normal. Judgment and thought content normal.    Health Maintenance Due  Topic Date Due  . MAMMOGRAM  06/22/2011    There are no preventive care reminders to display for this patient.  Lab Results  Component Value Date   TSH 10.290 (H) 03/11/2018   Lab Results  Component Value Date   WBC 7.9 11/13/2017   HGB 14.3 11/13/2017  HCT 41.3 11/13/2017   MCV 92 11/13/2017   PLT 193 11/13/2017   Lab Results  Component Value Date   NA 140 03/11/2018   K 3.8 03/11/2018   CO2 19 (L) 03/11/2018   GLUCOSE 106 (H) 03/11/2018   BUN 8 03/11/2018   CREATININE 1.11 (H) 03/11/2018   BILITOT 0.7 11/13/2017   ALKPHOS 83 11/13/2017   AST 21 11/13/2017   ALT 11 11/13/2017   PROT 8.2 11/13/2017   ALBUMIN 4.3 11/13/2017   CALCIUM 9.5 03/11/2018   Lab Results  Component Value Date   CHOL 117 05/15/2017   Lab Results  Component Value Date   HDL 33 (L) 05/15/2017   Lab Results  Component Value Date   LDLCALC 56 05/15/2017   Lab Results  Component Value Date   TRIG 142 05/15/2017   Lab Results  Component Value Date   CHOLHDL 3.5 05/15/2017   Lab Results  Component Value Date   HGBA1C 6.0 (H) 11/13/2017      Assessment & Plan:   Problem List Items Addressed This Visit      Cardiovascular and Mediastinum   Essential hypertension Patient's blood pressure is at goal of 139/89 or less. Condition is stable. Continue current medications and treatment plan. I recommend that you exercise for 30-45 minutes 5 days a week. I also recommend a balanced diet with fruits and vegetables every day, lean meats, and little fried foods. The DASH diet (you can find this online) is a good example of this.    Relevant Orders   CMP14+EGFR     Endocrine   Hypothyroidism - Primary Reviewed a years worth of  TSH with the patient and she has some lability She is compliant with her meds   Relevant Orders   Lipid panel   CMP14+EGFR   TSH + free T4     Other   MIXED HYPERLIPIDEMIA    -  Stable, will monitor, continue statin   Relevant Orders   Lipid panel   CMP14+EGFR    Other Visit Diagnoses    Stage 2 chronic kidney disease       Relevant Orders   CMP14+EGFR   History of stroke       Muscle spasticity    -  Continue fall precautions   Impacted cerumen of right ear    -  Discussed that the ear lavage did not resolve this, she should use debrox and return for repeat lavage   Relevant Orders   Ear wax removal      Meds ordered this encounter  Medications  . carbamide peroxide (DEBROX) 6.5 % OTIC solution    Sig: Place 5 drops into the right ear 2 (two) times daily.    Dispense:  15 mL    Refill:  0    Follow-up: Return in about 3 months (around 09/09/2018) for thyroid check .    Forrest Moron, MD

## 2018-06-12 LAB — CMP14+EGFR
ALT: 10 IU/L (ref 0–32)
AST: 20 IU/L (ref 0–40)
Albumin/Globulin Ratio: 1.3 (ref 1.2–2.2)
Albumin: 4.2 g/dL (ref 3.8–4.8)
Alkaline Phosphatase: 86 IU/L (ref 39–117)
BUN/Creatinine Ratio: 6 — ABNORMAL LOW (ref 12–28)
BUN: 6 mg/dL — AB (ref 8–27)
Bilirubin Total: 0.6 mg/dL (ref 0.0–1.2)
CALCIUM: 9.4 mg/dL (ref 8.7–10.3)
CO2: 20 mmol/L (ref 20–29)
CREATININE: 1.09 mg/dL — AB (ref 0.57–1.00)
Chloride: 107 mmol/L — ABNORMAL HIGH (ref 96–106)
GFR calc Af Amer: 59 mL/min/{1.73_m2} — ABNORMAL LOW (ref >59)
GFR calc non Af Amer: 52 mL/min/{1.73_m2} — ABNORMAL LOW (ref >59)
GLUCOSE: 101 mg/dL — AB (ref 65–99)
Globulin, Total: 3.2 g/dL (ref 1.5–4.5)
Potassium: 3.7 mmol/L (ref 3.5–5.2)
Sodium: 140 mmol/L (ref 134–144)
Total Protein: 7.4 g/dL (ref 6.0–8.5)

## 2018-06-12 LAB — TSH+FREE T4
Free T4: 0.87 ng/dL (ref 0.82–1.77)
TSH: 12.29 u[IU]/mL — ABNORMAL HIGH (ref 0.450–4.500)

## 2018-06-12 LAB — LIPID PANEL
Chol/HDL Ratio: 3.2 ratio (ref 0.0–4.4)
Cholesterol, Total: 124 mg/dL (ref 100–199)
HDL: 39 mg/dL — ABNORMAL LOW (ref >39)
LDL Calculated: 52 mg/dL (ref 0–99)
Triglycerides: 167 mg/dL — ABNORMAL HIGH (ref 0–149)
VLDL Cholesterol Cal: 33 mg/dL (ref 5–40)

## 2018-06-17 ENCOUNTER — Other Ambulatory Visit: Payer: Self-pay

## 2018-06-17 ENCOUNTER — Ambulatory Visit (INDEPENDENT_AMBULATORY_CARE_PROVIDER_SITE_OTHER): Payer: Medicare HMO | Admitting: Family Medicine

## 2018-06-17 ENCOUNTER — Encounter: Payer: Self-pay | Admitting: Family Medicine

## 2018-06-17 VITALS — BP 118/70 | HR 78 | Temp 98.0°F | Resp 16 | Ht 62.0 in | Wt 133.0 lb

## 2018-06-17 DIAGNOSIS — E039 Hypothyroidism, unspecified: Secondary | ICD-10-CM

## 2018-06-17 DIAGNOSIS — E034 Atrophy of thyroid (acquired): Secondary | ICD-10-CM

## 2018-06-17 DIAGNOSIS — H6121 Impacted cerumen, right ear: Secondary | ICD-10-CM | POA: Diagnosis not present

## 2018-06-17 MED ORDER — LEVOTHYROXINE SODIUM 50 MCG PO TABS: 50.0000 ug | ORAL_TABLET | Freq: Every day | ORAL | 0 refills | Status: DC

## 2018-06-17 NOTE — Patient Instructions (Addendum)
Please come in to get your thyroid labs drawn the day or 2 before your office visit.   Finish up your thyroid medication at home. Levothyroxine 23mcg by taking 2 tablets at a time then start THE NEW levothyroxine 66mcg dose.  Return to clinic by the end of May for labs then office visit    If you have lab work done today you will be contacted with your lab results within the next 2 weeks.  If you have not heard from [place] then please contact us. The fastest way to get your results is to register for My Chart.   IF you received an x-ray today, you will receive an invoice from [facility]. Please contact [facility] at [phone] with questions or concerns regarding your invoice.   IF you received labwork today, you will receive an invoice from [facility]. Please contact [facility] at [phone] with questions or concerns regarding your invoice.   Our billing staff will not be able to assist you with questions regarding bills from these companies.  You will be contacted with the lab results as soon as they are available. The fastest way to get your results is to activate your My Chart account. Instructions are located on the last page of this paperwork. If you have not heard from [place] regarding the results in 2 weeks, please contact this office.      Earwax Buildup, Adult The ears produce a substance called earwax that helps keep bacteria out of the ear and protects the skin in the ear canal. Occasionally, earwax can build up in the ear and cause discomfort or hearing loss. What increases the risk? This condition is more likely to develop in people who:  Are female.  Are elderly.  Naturally produce more earwax.  Clean their ears often with cotton swabs.  Use earplugs often.  Use in-ear headphones often.  Wear hearing aids.  Have narrow ear canals.  Have earwax that is overly thick or sticky.  Have eczema.  Are dehydrated.  Have excess hair in the ear  canal. What are the signs or symptoms? Symptoms of this condition include:  Reduced or muffled hearing.  A feeling of fullness in the ear or feeling that the ear is plugged.  Fluid coming from the ear.  Ear pain.  Ear itch.  Ringing in the ear.  Coughing.  An obvious piece of earwax that can be seen inside the ear canal. How is this diagnosed? This condition may be diagnosed based on:  Your symptoms.  Your medical history.  An ear exam. During the exam, your health care provider will look into your ear with an instrument called an otoscope. You may have tests, including a hearing test. How is this treated? This condition may be treated by:  Using ear drops to soften the earwax.  Having the earwax removed by a health care provider. The health care provider may: ? Flush the ear with water. ? Use an instrument that has a loop on the end (curette). ? Use a suction device.  Surgery to remove the wax buildup. This may be done in severe cases. Follow these instructions at home:   Take over-the-counter and prescription medicines only as told by your health care provider.  Do not put any objects, including cotton swabs, into your ear. You can clean the opening of your ear canal with a washcloth or facial tissue.  Follow instructions from your health care provider about cleaning your ears. Do not over-clean your ears.  Drink enough fluid to keep your urine clear or pale yellow. This will help to thin the earwax.  Keep all follow-up visits as told by your health care provider. If earwax builds up in your ears often or if you use hearing aids, consider seeing your health care provider for routine, preventive ear cleanings. Ask your health care provider how often you should schedule your cleanings.  If you have hearing aids, clean them according to instructions from the manufacturer and your health care provider. Contact a health care provider if:  You have ear pain.  You  develop a fever.  You have blood, pus, or other fluid coming from your ear.  You have hearing loss.  You have ringing in your ears that does not go away.  Your symptoms do not improve with treatment.  You feel like the room is spinning (vertigo). Summary  Earwax can build up in the ear and cause discomfort or hearing loss.  The most common symptoms of this condition include reduced or muffled hearing and a feeling of fullness in the ear or feeling that the ear is plugged.  This condition may be diagnosed based on your symptoms, your medical history, and an ear exam.  This condition may be treated by using ear drops to soften the earwax or by having the earwax removed by a health care provider.  Do not put any objects, including cotton swabs, into your ear. You can clean the opening of your ear canal with a washcloth or facial tissue. This information is not intended to replace advice given to you by your health care provider. Make sure you discuss any questions you have with your health care provider. Document Released: 05/09/2004 Document Revised: 03/13/2017 Document Reviewed: 06/12/2016 Elsevier Interactive Patient Education  2019 [employer].

## 2018-06-19 NOTE — Progress Notes (Signed)
Established Patient Office Visit  Subjective:  Patient ID: Victoria Mitchell, female    DOB: [birth year]  Age: 73 y.o. MRN: [id number]  CC:  Chief Complaint  Patient presents with  . Cerumen Impaction    pt states she needs for follow-up for both ears     HPI Victoria Mitchell presents for   Pt is here to follow up with cerumen impaction She tried debrox She denies any ear pain but her hearing is reduced   Hypothyroidism While she is here reviewed all her labs Her thyroid was out of range Lab Results  Component Value Date   TSH 12.290 (H) 06/11/2018  she denies symptoms of hypothyroidism She denies palpitations  She denies fatigue   Past Medical History:  Diagnosis Date  . Hypercholesterolemia   . Hypertension   . Hypokalemia   . Stroke ([place])   . Thyroid disease    hypothyroid    History reviewed. No pertinent surgical history.  Family History  Problem Relation Age of Onset  . Diabetes Father   . Hypertension Brother   . Heart disease Brother   . Heart attack Brother   . Heart disease Brother   . Hypertension Brother     Social History   Socioeconomic History  . Marital status: Married    Spouse name: Eduard Clos  . Number of children: 0  . Years of education: Not on file  . Highest education level: 11th grade  Occupational History  . Not on file  Social Needs  . Financial resource strain: Not hard at all  . Food insecurity:    Worry: Never true    Inability: Never true  . Transportation needs:    Medical: No    Non-medical: No  Tobacco Use  . Smoking status: Former Smoker    Packs/day: 0.50    Years: 30.00    Pack years: 15.00  . Smokeless tobacco: Never Used  Substance and Sexual Activity  . Alcohol use: No  . Drug use: No  . Sexual activity: Yes  Lifestyle  . Physical activity:    Days per week: 0 days    Minutes per session: 0 min  . Stress: Not at all  Relationships  . Social connections:    Talks on phone: More than three times a week    Gets together: More than three times a week    Attends religious service: Never    Active member of club or organization: No    Attends meetings of clubs or organizations: Never    Relationship status: Married  . Intimate partner violence:    Fear of current or ex partner: No    Emotionally abused: No    Physically abused: No    Forced sexual activity: No  Other Topics Concern  . Not on file  Social History Narrative  . Not on file    Outpatient Medications Prior to Visit  Medication Sig Dispense Refill  . amLODipine (NORVASC) 5 MG tablet Take 1 tablet (5 mg total) by mouth daily. 90 tablet 1  . atorvastatin (LIPITOR) 10 MG tablet Take 1 tablet (10 mg total) by mouth daily. 90 tablet 3  . baclofen (LIORESAL) 10 MG tablet TAKE 1 TABLET BY MOUTH THREE TIMES DAILY 90 each 0  . carbamide peroxide (DEBROX) 6.5 % OTIC solution Place 5 drops into the right ear 2 (two) times daily. 15 mL 0  . labetalol (NORMODYNE) 100 MG tablet TAKE 1 TABLET BY MOUTH TWICE DAILY  180 tablet 0  . losartan (COZAAR) 50 MG tablet TAKE 1 TABLET BY MOUTH ONCE DAILY 90 tablet 0  . levothyroxine (SYNTHROID, LEVOTHROID) 25 MCG tablet Take 1 tablet (25 mcg total) by mouth daily before breakfast. 90 tablet 1   No facility-administered medications prior to visit.     No Known Allergies  ROS Review of Systems See hpi   Objective:    Physical Exam  BP 118/70   Pulse 78   Temp 98 F (36.7 C) (Oral)   Resp 16   Ht 5\' 2"  (1.575 m)   Wt 133 lb (60.3 kg)   SpO2 98%   BMI 24.33 kg/m  Wt Readings from Last 3 Encounters:  06/17/18 133 lb (60.3 kg)  06/11/18 133 lb (60.3 kg)  03/11/18 130 lb 6.4 oz (59.1 kg)   Physical Exam  Constitutional: Oriented to person, place, and time. Appears well-developed and well-nourished.  HENT:  Head: Normocephalic and atraumatic.  Eyes: Conjunctivae and EOM are normal.  Ears: Bilateral cerumen impaction Pulmonary/Chest: Effort normal  Neurological: Is alert and oriented  to person, place, and time.  Skin: Skin is warm. Capillary refill takes less than 2 seconds.  Psychiatric: Has a normal mood and affect. Behavior is normal. Judgment and thought content normal.      Health Maintenance Due  Topic Date Due  . MAMMOGRAM  06/22/2011    There are no preventive care reminders to display for this patient.  Lab Results  Component Value Date   TSH 12.290 (H) 06/11/2018   Lab Results  Component Value Date   WBC 7.9 11/13/2017   HGB 14.3 11/13/2017   HCT 41.3 11/13/2017   MCV 92 11/13/2017   PLT 193 11/13/2017   Lab Results  Component Value Date   NA 140 06/11/2018   K 3.7 06/11/2018   CO2 20 06/11/2018   GLUCOSE 101 (H) 06/11/2018   BUN 6 (L) 06/11/2018   CREATININE 1.09 (H) 06/11/2018   BILITOT 0.6 06/11/2018   ALKPHOS 86 06/11/2018   AST 20 06/11/2018   ALT 10 06/11/2018   PROT 7.4 06/11/2018   ALBUMIN 4.2 06/11/2018   CALCIUM 9.4 06/11/2018   Lab Results  Component Value Date   CHOL 124 06/11/2018   Lab Results  Component Value Date   HDL 39 (L) 06/11/2018   Lab Results  Component Value Date   LDLCALC 52 06/11/2018   Lab Results  Component Value Date   TRIG 167 (H) 06/11/2018   Lab Results  Component Value Date   CHOLHDL 3.2 06/11/2018   Lab Results  Component Value Date   HGBA1C 6.0 (H) 11/13/2017      Assessment & Plan:   Problem List Items Addressed This Visit      Endocrine   Hypothyroidism - Primary Advised levothyroxine levels were too low Increased dose 7mcg   Relevant Medications   levothyroxine (SYNTHROID, LEVOTHROID) 50 MCG tablet    Other Visit Diagnoses    Impacted cerumen of right ear    - resolved      Meds ordered this encounter  Medications  . levothyroxine (SYNTHROID, LEVOTHROID) 50 MCG tablet    Sig: Take 1 tablet (50 mcg total) by mouth daily before breakfast.    Dispense:  90 tablet    Refill:  0    Follow-up: Return in about 12 weeks (around 09/09/2018) for thyroid check .     Forrest Moron, MD

## 2018-06-29 ENCOUNTER — Other Ambulatory Visit: Payer: Self-pay | Admitting: Family Medicine

## 2018-06-29 DIAGNOSIS — M62838 Other muscle spasm: Secondary | ICD-10-CM

## 2018-08-02 ENCOUNTER — Other Ambulatory Visit: Payer: Self-pay | Admitting: Family Medicine

## 2018-08-02 DIAGNOSIS — I10 Essential (primary) hypertension: Secondary | ICD-10-CM

## 2018-08-25 ENCOUNTER — Other Ambulatory Visit: Payer: Self-pay | Admitting: Family Medicine

## 2018-08-25 DIAGNOSIS — I10 Essential (primary) hypertension: Secondary | ICD-10-CM

## 2018-09-08 ENCOUNTER — Other Ambulatory Visit: Payer: Self-pay | Admitting: Family Medicine

## 2018-09-08 DIAGNOSIS — I10 Essential (primary) hypertension: Secondary | ICD-10-CM

## 2018-09-08 DIAGNOSIS — E039 Hypothyroidism, unspecified: Secondary | ICD-10-CM

## 2018-09-10 ENCOUNTER — Ambulatory Visit: Payer: Medicare HMO | Admitting: Family Medicine

## 2018-09-11 ENCOUNTER — Other Ambulatory Visit: Payer: Self-pay | Admitting: Family Medicine

## 2018-09-11 DIAGNOSIS — E039 Hypothyroidism, unspecified: Secondary | ICD-10-CM

## 2018-09-11 NOTE — Telephone Encounter (Signed)
Requested medication (s) are due for refill today: yes  Requested medication (s) are on the active medication list: yes  Last refill: 06/14/2018  Future visit scheduled:No  Notes to clinic:  TSH needed.    Requested Prescriptions  Pending Prescriptions Disp Refills   EUTHYROX 50 MCG tablet [Pharmacy Med Name: Euthyrox 50 MCG Oral Tablet] 90 tablet 0    Sig: TAKE 1 TABLET BY MOUTH ONCE DAILY BEFORE BREAKFAST     Endocrinology:  Hypothyroid Agents Failed - 09/11/2018  3:10 PM      Failed - TSH needs to be rechecked within 3 months after an abnormal result. Refill until TSH is due.      Failed - TSH in normal range and within 360 days    TSH  Date Value Ref Range Status  06/11/2018 12.290 (H) 0.450 - 4.500 uIU/mL Final         Passed - Valid encounter within last 12 months    Recent Outpatient Visits          2 months ago Hypothyroidism due to acquired atrophy of thyroid   [facility] at [facility], [place], [place]   3 months ago Hypothyroidism due to acquired atrophy of thyroid   Primary Care at [facility], Arlie Solomons, MD   6 months ago Hypothyroidism, unspecified type   [facility] at [facility], Ranell Patrick, MD   9 months ago Essential hypertension   [facility] at [place], [place] D, PA-C   10 months ago Essential hypertension   Primary [facility] at [place], [place] D, [place]

## 2018-09-22 ENCOUNTER — Other Ambulatory Visit: Payer: Self-pay

## 2018-09-22 ENCOUNTER — Ambulatory Visit (INDEPENDENT_AMBULATORY_CARE_PROVIDER_SITE_OTHER): Payer: Medicare HMO | Admitting: Family Medicine

## 2018-09-22 VITALS — BP 118/70 | Ht 62.0 in | Wt 133.0 lb

## 2018-09-22 DIAGNOSIS — Z0000 Encounter for general adult medical examination without abnormal findings: Secondary | ICD-10-CM

## 2018-09-22 NOTE — Patient Instructions (Addendum)
Thank you for taking time to come for your Medicare Wellness Visit. I appreciate your ongoing commitment to your health goals. Please review the following plan we discussed and let me know if I can assist you in the future.  Brittany Amirault LPN  Healthy Eating Following a healthy eating pattern may help you to achieve and maintain a healthy body weight, reduce the risk of chronic disease, and live a long and productive life. It is important to follow a healthy eating pattern at an appropriate calorie level for your body. Your nutritional needs should be met primarily through food by choosing a variety of nutrient-rich foods. What are tips for following this plan? Reading food labels  Read labels and choose the following: ? Reduced or low sodium. ? Juices with 100% fruit juice. ? Foods with low saturated fats and high polyunsaturated and monounsaturated fats. ? Foods with whole grains, such as whole wheat, cracked wheat, brown rice, and wild rice. ? Whole grains that are fortified with folic acid. This is recommended for women who are pregnant or who want to become pregnant.  Read labels and avoid the following: ? Foods with a lot of added sugars. These include foods that contain brown sugar, corn sweetener, corn syrup, dextrose, fructose, glucose, high-fructose corn syrup, honey, invert sugar, lactose, malt syrup, maltose, molasses, raw sugar, sucrose, trehalose, or turbinado sugar.  Do not eat more than the following amounts of added sugar per day:  6 teaspoons (25 g) for women.  9 teaspoons (38 g) for men. ? Foods that contain processed or refined starches and grains. ? Refined grain products, such as white flour, degermed cornmeal, white bread, and white rice. Shopping  Choose nutrient-rich snacks, such as vegetables, whole fruits, and nuts. Avoid high-calorie and high-sugar snacks, such as potato chips, fruit snacks, and candy.  Use oil-based dressings and spreads on foods instead of  solid fats such as butter, stick margarine, or cream cheese.  Limit pre-made sauces, mixes, and "instant" products such as flavored rice, instant noodles, and ready-made pasta.  Try more plant-protein sources, such as tofu, tempeh, black beans, edamame, lentils, nuts, and seeds.  Explore eating plans such as the Mediterranean diet or vegetarian diet. Cooking  Use oil to saut or stir-fry foods instead of solid fats such as butter, stick margarine, or lard.  Try baking, boiling, grilling, or broiling instead of frying.  Remove the fatty part of meats before cooking.  Steam vegetables in water or broth. Meal planning   At meals, imagine dividing your plate into fourths: ? One-half of your plate is fruits and vegetables. ? One-fourth of your plate is whole grains. ? One-fourth of your plate is protein, especially lean meats, poultry, eggs, tofu, beans, or nuts.  Include low-fat dairy as part of your daily diet. Lifestyle  Choose healthy options in all settings, including home, work, school, restaurants, or stores.  Prepare your food safely: ? Wash your hands after handling raw meats. ? Keep food preparation surfaces clean by regularly washing with hot, soapy water. ? Keep raw meats separate from ready-to-eat foods, such as fruits and vegetables. ? Cook seafood, meat, poultry, and eggs to the recommended internal temperature. ? Store foods at safe temperatures. In general:  Keep cold foods at 40F (4.4C) or below.  Keep hot foods at 140F (60C) or above.  Keep your freezer at 0F (-17.8C) or below.  Foods are no longer safe to eat when they have been between the temperatures of 40-140F (4.4-60C) for   more than 2 hours. What foods should I eat? Fruits Aim to eat 2 cup-equivalents of fresh, canned (in natural juice), or frozen fruits each day. Examples of 1 cup-equivalent of fruit include 1 small apple, 8 large strawberries, 1 cup canned fruit,  cup dried fruit, or 1 cup  100% juice. Vegetables Aim to eat 2-3 cup-equivalents of fresh and frozen vegetables each day, including different varieties and colors. Examples of 1 cup-equivalent of vegetables include 2 medium carrots, 2 cups raw, leafy greens, 1 cup chopped vegetable (raw or cooked), or 1 medium baked potato. Grains Aim to eat 6 ounce-equivalents of whole grains each day. Examples of 1 ounce-equivalent of grains include 1 slice of bread, 1 cup ready-to-eat cereal, 3 cups popcorn, or  cup cooked rice, pasta, or cereal. Meats and other proteins Aim to eat 5-6 ounce-equivalents of protein each day. Examples of 1 ounce-equivalent of protein include 1 egg, 1/2 cup nuts or seeds, or 1 tablespoon (16 g) peanut butter. A cut of meat or fish that is the size of a deck of cards is about 3-4 ounce-equivalents.  Of the protein you eat each week, try to have at least 8 ounces come from seafood. This includes salmon, trout, herring, and anchovies. Dairy Aim to eat 3 cup-equivalents of fat-free or low-fat dairy each day. Examples of 1 cup-equivalent of dairy include 1 cup (240 mL) milk, 8 ounces (250 g) yogurt, 1 ounces (44 g) natural cheese, or 1 cup (240 mL) fortified soy milk. Fats and oils  Aim for about 5 teaspoons (21 g) per day. Choose monounsaturated fats, such as canola and olive oils, avocados, peanut butter, and most nuts, or polyunsaturated fats, such as sunflower, corn, and soybean oils, walnuts, pine nuts, sesame seeds, sunflower seeds, and flaxseed. Beverages  Aim for six 8-oz glasses of water per day. Limit coffee to three to five 8-oz cups per day.  Limit caffeinated beverages that have added calories, such as soda and energy drinks.  Limit alcohol intake to no more than 1 drink a day for nonpregnant women and 2 drinks a day for men. One drink equals 12 oz of beer (355 mL), 5 oz of wine (148 mL), or 1 oz of hard liquor (44 mL). Seasoning and other foods  Avoid adding excess amounts of salt to your  foods. Try flavoring foods with herbs and spices instead of salt.  Avoid adding sugar to foods.  Try using oil-based dressings, sauces, and spreads instead of solid fats. This information is based on general [place] nutrition guidelines. For more information, visit choosemyplate.gov. Exact amounts may vary based on your nutrition needs. Summary  A healthy eating plan may help you to maintain a healthy weight, reduce the risk of chronic diseases, and stay active throughout your life.  Plan your meals. Make sure you eat the right portions of a variety of nutrient-rich foods.  Try baking, boiling, grilling, or broiling instead of frying.  Choose healthy options in all settings, including home, work, school, restaurants, or stores. This information is not intended to replace advice given to you by your health care provider. Make sure you discuss any questions you have with your health care provider. Document Released: 07/14/2017 Document Revised: 07/14/2017 Document Reviewed: 07/14/2017 Elsevier Interactive Patient Education  2019 Elsevier Inc.  

## 2018-09-22 NOTE — Progress Notes (Signed)
Presents today for [employer] Visit   Date of last exam: 06/17/2018  Interpreter used for this visit?no  I connected with  Pryor Ochoa on 09/22/18 by telephone  and verified that I am speaking with the correct person using two identifiers.   I discussed the limitations of evaluation and management by telemedicine. The patient expressed understanding and agreed to proceed.   Patient Care Team: Forrest Moron, MD as PCP - General (Internal Medicine)   Other items to address today  Hearing Screening Comments: Patient has never had a hearing exam. Declined  Vision Screening Comments: Patient goes to [facility] for routine eye exams every  One - Two years     Other Screening: Last screening for diabetes: 11/13/2017 Last lipid screening: 06/11/2018  ADVANCE DIRECTIVES: Discussed: yes  On File: no Materials Provided: no (patient declined)  Immunization status:  Immunization History  Administered Date(s) Administered  . H1N1 09/08/2008  . Influenza Whole 05/14/2007, 01/27/2008  . Pneumococcal Polysaccharide-23 01/27/2008  . Td 05/14/2007     Health Maintenance Due  Topic Date Due  . MAMMOGRAM  06/22/2011     Functional Status Survey: Is the patient deaf or have difficulty hearing?: No Does the patient have difficulty seeing, even when wearing glasses/contacts?: No Does the patient have difficulty concentrating, remembering, or making decisions?: No Does the patient have difficulty walking or climbing stairs?: No Does the patient have difficulty dressing or bathing?: No Does the patient have difficulty doing errands alone such as visiting a doctor's office or shopping?: No   6CIT Screen 09/22/2018 05/29/2017  What Year? 0 points 0 points  What month? 0 points 0 points  What time? 0 points 0 points  Count back from 20 0 points 0 points  Months in reverse 0 points 0 points  Repeat phrase 0 points 2 points  Total Score 0 2        Clinical  Support from 09/22/2018 in [facility]  AUDIT-C Score  0       Home Environment:   Lives in one story home with husband and sister  What is the last grade level you completed in school?: 11th grade   No loose throw rugs in walkways, pet beds, electrical cords, etc?         Grab bars in the bathroom? yes      Handrails on the stairs?   yes      Adequate lighting?   yes  Does not climb had stroke in 99 has to have help climbing tries to avoid if can.    Patient Active Problem List   Diagnosis Date Noted  . GOUT 01/10/2010  . 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 KNEE PAIN, RIGHT 12/19/2009  . BREAST MASS, LEFT 06/16/2009  . INSOMNIA 06/16/2009  . WART, VIRAL 12/22/2008  . OSTEOPENIA 11/02/2008  . Hypothyroidism 10/30/2008  . GOITER, MULTINODULAR 10/21/2008  . DYSPHAGIA UNSPECIFIED 10/21/2008  . POSTMENOPAUSAL STATUS 10/21/2008  . PALPITATIONS 09/02/2008  . EPIGASTRIC PAIN 09/02/2008  . EDEMA, ANKLES 09/11/2007  . RENAL INSUFFICIENCY 08/21/2007  . FIBROIDS, UTERUS 07/21/2007  . 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 Essential hypertension 05/14/2007  . ALLERGIC RHINITIS 05/14/2007  . HYPERTHYROIDISM, HX OF 04/02/2002  . STROKE, HX OF 09/13/1997     Past Medical History:  Diagnosis Date  . Hypercholesterolemia   . Hypertension   . Hypokalemia   . Stroke ([place])   . Thyroid disease  hypothyroid     History reviewed. No pertinent surgical history.   Family History  Problem Relation Age of Onset  . Diabetes Father   . Hypertension Brother   . Heart disease Brother   . Heart attack Brother   . Heart disease Brother   . Hypertension Brother      Social History   Socioeconomic History  . Marital status: Married    Spouse name: Eduard Clos  . Number of children: 0  . Years of education: Not on file  . Highest education level: 11th grade  Occupational History  . Not on file  Social Needs  . Financial resource strain: Not hard at all  .  Food insecurity:    Worry: Never true    Inability: Never true  . Transportation needs:    Medical: No    Non-medical: No  Tobacco Use  . Smoking status: Former Smoker    Packs/day: 0.50    Years: 30.00    Pack years: 15.00  . Smokeless tobacco: Never Used  Substance and Sexual Activity  . Alcohol use: No  . Drug use: No  . Sexual activity: Yes  Lifestyle  . Physical activity:    Days per week: 0 days    Minutes per session: 0 min  . Stress: Not at all  Relationships  . Social connections:    Talks on phone: More than three times a week    Gets together: More than three times a week    Attends religious service: Never    Active member of club or organization: No    Attends meetings of clubs or organizations: Never    Relationship status: Married  . Intimate partner violence:    Fear of current or ex partner: No    Emotionally abused: No    Physically abused: No    Forced sexual activity: No  Other Topics Concern  . Not on file  Social History Narrative  . Not on file     No Known Allergies   Prior to Admission medications   Medication Sig Start Date End Date Taking? Authorizing Provider  amLODipine (NORVASC) 5 MG tablet Take 1 tablet by mouth once daily 09/08/18  Yes Stallings, Zoe A, MD  atorvastatin (LIPITOR) 10 MG tablet Take 1 tablet (10 mg total) by mouth daily. 11/13/17  Yes Timmothy Euler, [place] D, PA-C  baclofen (LIORESAL) 10 MG tablet TAKE 1 TABLET BY MOUTH THREE TIMES DAILY **NEED  OFFICE  VISIT  FOR  MORE  REFILLS** 06/30/18  Yes Forrest Moron, MD  EUTHYROX 50 MCG tablet TAKE 1 TABLET BY MOUTH ONCE DAILY BEFORE BREAKFAST 09/14/18  Yes Delia Chimes A, MD  labetalol (NORMODYNE) 100 MG tablet Take 1 tablet by mouth twice daily 08/02/18  Yes Stallings, Zoe A, MD  losartan (COZAAR) 50 MG tablet Take 1 tablet by mouth once daily 08/25/18  Yes Stallings, Zoe A, MD  carbamide peroxide (DEBROX) 6.5 % OTIC solution Place 5 drops into the right ear 2 (two) times daily.  06/11/18   Forrest Moron, MD     Depression screen [facility] 2/9 09/22/2018 06/17/2018 06/11/2018 03/11/2018 11/29/2017  Decreased Interest 0 0 0 0 0  Down, Depressed, Hopeless 0 0 0 0 0  PHQ - 2 Score 0 0 0 0 0     Fall Risk  09/22/2018 06/17/2018 06/11/2018 03/11/2018 11/29/2017  Falls in the past year? 0 0 0 0 No  Number falls in past yr: 0 - 0 - -  Injury with Fall? 0 0 0 - -  Risk for fall due to : - - Impaired balance/gait - -  Follow up Falls evaluation completed;Education provided;Falls prevention discussed - Falls evaluation completed - -      PHYSICAL EXAM: BP 118/70   Ht 5\' 2"  (1.575 m)   Wt 133 lb (60.3 kg)   BMI 24.33 kg/m    Wt Readings from Last 3 Encounters:  09/22/18 133 lb (60.3 kg)  06/17/18 133 lb (60.3 kg)  06/11/18 133 lb (60.3 kg)     No exam data present    Physical Exam   Education/Counseling provided regarding diet and exercise, prevention of chronic diseases, smoking/tobacco cessation, if applicable, and reviewed "Covered Medicare Preventive Services."   ASSESSMENT/PLAN: 1. Medicare annual wellness visit, subsequent   Cardiac Risk Factors include: advanced age (>29men, >2 women);dyslipidemia;hypertension

## 2018-10-16 ENCOUNTER — Other Ambulatory Visit: Payer: Self-pay | Admitting: Family Medicine

## 2018-10-16 DIAGNOSIS — E039 Hypothyroidism, unspecified: Secondary | ICD-10-CM

## 2018-10-16 NOTE — Telephone Encounter (Signed)
Forwarding medication refill to PCP for review. 

## 2018-10-20 ENCOUNTER — Other Ambulatory Visit: Payer: Self-pay | Admitting: Family Medicine

## 2018-10-20 DIAGNOSIS — E039 Hypothyroidism, unspecified: Secondary | ICD-10-CM

## 2018-10-22 ENCOUNTER — Telehealth: Payer: Self-pay | Admitting: Family Medicine

## 2018-10-22 NOTE — Telephone Encounter (Signed)
Check phone message from earlier.

## 2018-10-22 NOTE — Telephone Encounter (Signed)
Pt has been out of her EUTHYROX 50 MCG tablet [[id number]] for 5 days. She was wondering if she could have a couple of pills to last her until her appointment on 10/26/18.Pharmacy:  [facility], [place]. Please advise at [phone]

## 2018-10-23 ENCOUNTER — Other Ambulatory Visit: Payer: Self-pay

## 2018-10-23 DIAGNOSIS — E039 Hypothyroidism, unspecified: Secondary | ICD-10-CM

## 2018-10-23 MED ORDER — LEVOTHYROXINE SODIUM 50 MCG PO TABS: ORAL_TABLET | ORAL | 0 refills | Status: DC

## 2018-10-23 NOTE — Telephone Encounter (Signed)
Rx sent to pharmacy   

## 2018-10-26 ENCOUNTER — Encounter: Payer: Self-pay | Admitting: Family Medicine

## 2018-10-26 ENCOUNTER — Ambulatory Visit (INDEPENDENT_AMBULATORY_CARE_PROVIDER_SITE_OTHER): Payer: Medicare HMO | Admitting: Family Medicine

## 2018-10-26 ENCOUNTER — Other Ambulatory Visit: Payer: Self-pay

## 2018-10-26 VITALS — BP 140/78 | HR 85 | Temp 98.6°F | Resp 18 | Ht 62.0 in | Wt 130.6 lb

## 2018-10-26 DIAGNOSIS — E034 Atrophy of thyroid (acquired): Secondary | ICD-10-CM

## 2018-10-26 NOTE — Progress Notes (Signed)
Established Patient Office Visit  Subjective:  Patient ID: Victoria Mitchell, female    DOB: [birth year]  Age: 73 y.o. MRN: [id number]  CC:  Chief Complaint  Patient presents with  . Medication Refill    all the medication    HPI Victoria Mitchell presents for   Patient reports that she had to cancel her appontment due to the death of her sister in 09/11/18 She states that she has been out of her medication levothyroxine for six days She just restarted her medication 3 days ago She states that she has been sweating but thinks it is the heat Lab Results  Component Value Date   TSH 12.290 (H) 06/11/2018     Past Medical History:  Diagnosis Date  . Hypercholesterolemia   . Hypertension   . Hypokalemia   . Stroke ([place])   . Thyroid disease    hypothyroid    No past surgical history on file.  Family History  Problem Relation Age of Onset  . Diabetes Father   . Hypertension Brother   . Heart disease Brother   . Heart attack Brother   . Heart disease Brother   . Hypertension Brother     Social History   Socioeconomic History  . Marital status: Married    Spouse name: Eduard Clos  . Number of children: 0  . Years of education: Not on file  . Highest education level: 11th grade  Occupational History  . Not on file  Social Needs  . Financial resource strain: Not hard at all  . Food insecurity    Worry: Never true    Inability: Never true  . Transportation needs    Medical: No    Non-medical: No  Tobacco Use  . Smoking status: Former Smoker    Packs/day: 0.50    Years: 30.00    Pack years: 15.00  . Smokeless tobacco: Never Used  Substance and Sexual Activity  . Alcohol use: No  . Drug use: No  . Sexual activity: Yes  Lifestyle  . Physical activity    Days per week: 0 days    Minutes per session: 0 min  . Stress: Not at all  Relationships  . Social connections    Talks on phone: More than three times a week    Gets together: More than three times a week   Attends religious service: Never    Active member of club or organization: No    Attends meetings of clubs or organizations: Never    Relationship status: Married  . Intimate partner violence    Fear of current or ex partner: No    Emotionally abused: No    Physically abused: No    Forced sexual activity: No  Other Topics Concern  . Not on file  Social History Narrative  . Not on file    Outpatient Medications Prior to Visit  Medication Sig Dispense Refill  . amLODipine (NORVASC) 5 MG tablet Take 1 tablet by mouth once daily 90 tablet 0  . atorvastatin (LIPITOR) 10 MG tablet Take 1 tablet (10 mg total) by mouth daily. 90 tablet 3  . baclofen (LIORESAL) 10 MG tablet TAKE 1 TABLET BY MOUTH THREE TIMES DAILY **NEED  OFFICE  VISIT  FOR  MORE  REFILLS** 180 each 0  . carbamide peroxide (DEBROX) 6.5 % OTIC solution Place 5 drops into the right ear 2 (two) times daily. 15 mL 0  . labetalol (NORMODYNE) 100 MG tablet Take 1  tablet by mouth twice daily 180 tablet 0  . levothyroxine (EUTHYROX) 50 MCG tablet TAKE 1 TABLET BY MOUTH ONCE DAILY BEFORE BREAKFAST 30 tablet 0  . losartan (COZAAR) 50 MG tablet Take 1 tablet by mouth once daily 90 tablet 0   No facility-administered medications prior to visit.     No Known Allergies  ROS Review of Systems Review of Systems  Constitutional: Negative for activity change, appetite change, chills and fever.  HENT: Negative for congestion, nosebleeds, trouble swallowing and voice change.   Respiratory: Negative for cough, shortness of breath and wheezing.   Gastrointestinal: Negative for diarrhea, nausea and vomiting.  Genitourinary: Negative for difficulty urinating, dysuria, flank pain and hematuria.  See HPI. All other review of systems negative.     Objective:    Physical Exam  BP (!) 147/81 (BP Location: Left Arm, Patient Position: Standing, Cuff Size: Normal)   Pulse 85   Temp 98.6 F (37 C) (Oral)   Resp 18   Ht '5\' 2"'$  (1.575 m)   Wt  130 lb 9.6 oz (59.2 kg)   SpO2 96%   BMI 23.89 kg/m  Wt Readings from Last 3 Encounters:  10/26/18 130 lb 9.6 oz (59.2 kg)  09/22/18 133 lb (60.3 kg)  06/17/18 133 lb (60.3 kg)   Physical Exam  Constitutional: Oriented to person, place, and time. Appears well-developed and well-nourished.  HENT:  Head: Normocephalic and atraumatic.  Eyes: Conjunctivae and EOM are normal.  Cardiovascular: Normal rate, regular rhythm, normal heart sounds and intact distal pulses.  No murmur heard. Pulmonary/Chest: Effort normal and breath sounds normal. No stridor. No respiratory distress. Has no wheezes.  Neurological: Is alert and oriented to person, place, and time.  Skin: Skin is warm. Capillary refill takes less than 2 seconds.  Psychiatric: Has a normal mood and affect. Behavior is normal. Judgment and thought content normal.    Health Maintenance Due  Topic Date Due  . MAMMOGRAM  06/22/2011    There are no preventive care reminders to display for this patient.  Lab Results  Component Value Date   TSH 12.290 (H) 06/11/2018   Lab Results  Component Value Date   WBC 7.9 11/13/2017   HGB 14.3 11/13/2017   HCT 41.3 11/13/2017   MCV 92 11/13/2017   PLT 193 11/13/2017   Lab Results  Component Value Date   NA 140 06/11/2018   K 3.7 06/11/2018   CO2 20 06/11/2018   GLUCOSE 101 (H) 06/11/2018   BUN 6 (L) 06/11/2018   CREATININE 1.09 (H) 06/11/2018   BILITOT 0.6 06/11/2018   ALKPHOS 86 06/11/2018   AST 20 06/11/2018   ALT 10 06/11/2018   PROT 7.4 06/11/2018   ALBUMIN 4.2 06/11/2018   CALCIUM 9.4 06/11/2018   Lab Results  Component Value Date   CHOL 124 06/11/2018   Lab Results  Component Value Date   HDL 39 (L) 06/11/2018   Lab Results  Component Value Date   LDLCALC 52 06/11/2018   Lab Results  Component Value Date   TRIG 167 (H) 06/11/2018   Lab Results  Component Value Date   CHOLHDL 3.2 06/11/2018   Lab Results  Component Value Date   HGBA1C 6.0 (H)  11/13/2017      Assessment & Plan:   Problem List Items Addressed This Visit      Endocrine   Hypothyroidism - Primary   Relevant Orders   TSH + free T4   CMP14+EGFR   Lipid panel  TSH + free T4    will assess dose, pt may still be out of range but she has had 3 days worth and only missed six days Anticipate dose change to help pt get in goal   No orders of the defined types were placed in this encounter.   Follow-up: Return in about 3 months (around 01/26/2019).    Forrest Moron, MD

## 2018-10-26 NOTE — Patient Instructions (Signed)
° ° ° °  If you have lab work done today you will be contacted with your lab results within the next 2 weeks.  If you have not heard from us then please contact us. The fastest way to get your results is to register for My Chart. ° ° °IF you received an x-ray today, you will receive an invoice from [facility]. Please contact [facility] at [phone] with questions or concerns regarding your invoice.  ° °IF you received labwork today, you will receive an invoice from [facility]. Please contact [facility] at [phone] with questions or concerns regarding your invoice.  ° °Our billing staff will not be able to assist you with questions regarding bills from these companies. ° °You will be contacted with the lab results as soon as they are available. The fastest way to get your results is to activate your My Chart account. Instructions are located on the last page of this paperwork. If you have not heard from us regarding the results in 2 weeks, please contact this office. °  ° ° ° °

## 2018-10-27 LAB — CMP14+EGFR
ALT: 9 IU/L (ref 0–32)
AST: 16 IU/L (ref 0–40)
Albumin/Globulin Ratio: 1.3 (ref 1.2–2.2)
Albumin: 4.2 g/dL (ref 3.8–4.8)
Alkaline Phosphatase: 71 IU/L (ref 39–117)
BUN/Creatinine Ratio: 7 — ABNORMAL LOW (ref 12–28)
BUN: 8 mg/dL (ref 8–27)
Bilirubin Total: 0.9 mg/dL (ref 0.0–1.2)
CO2: 19 mmol/L — ABNORMAL LOW (ref 20–29)
Calcium: 9.2 mg/dL (ref 8.7–10.3)
Chloride: 108 mmol/L — ABNORMAL HIGH (ref 96–106)
Creatinine, Ser: 1.15 mg/dL — ABNORMAL HIGH (ref 0.57–1.00)
GFR calc Af Amer: 56 mL/min/{1.73_m2} — ABNORMAL LOW (ref >59)
GFR calc non Af Amer: 48 mL/min/{1.73_m2} — ABNORMAL LOW (ref >59)
Globulin, Total: 3.3 g/dL (ref 1.5–4.5)
Glucose: 90 mg/dL (ref 65–99)
Potassium: 4 mmol/L (ref 3.5–5.2)
Sodium: 141 mmol/L (ref 134–144)
Total Protein: 7.5 g/dL (ref 6.0–8.5)

## 2018-10-27 LAB — LIPID PANEL
Chol/HDL Ratio: 3.1 ratio (ref 0.0–4.4)
Cholesterol, Total: 123 mg/dL (ref 100–199)
HDL: 40 mg/dL (ref >39)
LDL Calculated: 63 mg/dL (ref 0–99)
Triglycerides: 102 mg/dL (ref 0–149)
VLDL Cholesterol Cal: 20 mg/dL (ref 5–40)

## 2018-10-27 LAB — TSH+FREE T4
Free T4: 1 ng/dL (ref 0.82–1.77)
TSH: 4.65 u[IU]/mL — ABNORMAL HIGH (ref 0.450–4.500)

## 2018-10-28 ENCOUNTER — Other Ambulatory Visit: Payer: Self-pay | Admitting: Family Medicine

## 2018-10-28 DIAGNOSIS — E039 Hypothyroidism, unspecified: Secondary | ICD-10-CM

## 2018-10-28 MED ORDER — LEVOTHYROXINE SODIUM 50 MCG PO TABS: ORAL_TABLET | ORAL | 1 refills | Status: DC

## 2018-10-29 ENCOUNTER — Telehealth: Payer: Self-pay | Admitting: Family Medicine

## 2018-10-29 DIAGNOSIS — M62838 Other muscle spasm: Secondary | ICD-10-CM

## 2018-10-30 ENCOUNTER — Other Ambulatory Visit: Payer: Self-pay | Admitting: Family Medicine

## 2018-10-30 DIAGNOSIS — M62838 Other muscle spasm: Secondary | ICD-10-CM

## 2018-10-30 MED ORDER — BACLOFEN 10 MG PO TABS: ORAL_TABLET | ORAL | 0 refills | Status: DC

## 2018-10-30 NOTE — Telephone Encounter (Signed)
Rx was sent to pharmacy. 

## 2018-10-30 NOTE — Telephone Encounter (Signed)
Message has been sent to provider about medication refill, will inform pt about providers response.

## 2018-10-30 NOTE — Telephone Encounter (Signed)
Pt called in for an update on the refill request.

## 2018-11-05 ENCOUNTER — Other Ambulatory Visit: Payer: Self-pay | Admitting: Family Medicine

## 2018-11-05 DIAGNOSIS — I10 Essential (primary) hypertension: Secondary | ICD-10-CM

## 2018-11-05 DIAGNOSIS — E782 Mixed hyperlipidemia: Secondary | ICD-10-CM

## 2018-11-05 MED ORDER — LABETALOL HCL 100 MG PO TABS: 100.0000 mg | ORAL_TABLET | Freq: Two times a day (BID) | ORAL | 1 refills | Status: DC

## 2018-11-05 MED ORDER — AMLODIPINE BESYLATE 5 MG PO TABS: 5.0000 mg | ORAL_TABLET | Freq: Every day | ORAL | 1 refills | Status: DC

## 2018-11-05 MED ORDER — LOSARTAN POTASSIUM 50 MG PO TABS: 50.0000 mg | ORAL_TABLET | Freq: Every day | ORAL | 1 refills | Status: DC

## 2018-11-05 MED ORDER — ATORVASTATIN CALCIUM 10 MG PO TABS: 10.0000 mg | ORAL_TABLET | Freq: Every day | ORAL | 3 refills | Status: DC

## 2018-11-05 NOTE — Telephone Encounter (Signed)
Forwarding medication refill to PCP for review. 

## 2018-11-25 ENCOUNTER — Other Ambulatory Visit: Payer: Self-pay | Admitting: Family Medicine

## 2018-11-25 DIAGNOSIS — I10 Essential (primary) hypertension: Secondary | ICD-10-CM

## 2018-11-25 NOTE — Telephone Encounter (Signed)
Forwarding medication refill request to clinical pool for review. 

## 2019-01-25 ENCOUNTER — Ambulatory Visit (INDEPENDENT_AMBULATORY_CARE_PROVIDER_SITE_OTHER): Payer: Medicare HMO | Admitting: Family Medicine

## 2019-01-25 ENCOUNTER — Encounter: Payer: Self-pay | Admitting: Family Medicine

## 2019-01-25 ENCOUNTER — Other Ambulatory Visit: Payer: Self-pay

## 2019-01-25 VITALS — BP 128/78 | HR 77 | Temp 97.7°F | Resp 16 | Ht 62.0 in | Wt 127.2 lb

## 2019-01-25 DIAGNOSIS — I6930 Unspecified sequelae of cerebral infarction: Secondary | ICD-10-CM

## 2019-01-25 DIAGNOSIS — I69931 Monoplegia of upper limb following unspecified cerebrovascular disease affecting right dominant side: Secondary | ICD-10-CM | POA: Diagnosis not present

## 2019-01-25 DIAGNOSIS — E034 Atrophy of thyroid (acquired): Secondary | ICD-10-CM | POA: Diagnosis not present

## 2019-01-25 DIAGNOSIS — Z1159 Encounter for screening for other viral diseases: Secondary | ICD-10-CM

## 2019-01-25 DIAGNOSIS — I10 Essential (primary) hypertension: Secondary | ICD-10-CM | POA: Diagnosis not present

## 2019-01-25 DIAGNOSIS — N189 Chronic kidney disease, unspecified: Secondary | ICD-10-CM | POA: Diagnosis not present

## 2019-01-25 DIAGNOSIS — N182 Chronic kidney disease, stage 2 (mild): Secondary | ICD-10-CM

## 2019-01-25 NOTE — Patient Instructions (Signed)
° ° ° °  If you have lab work done today you will be contacted with your lab results within the next 2 weeks.  If you have not heard from us then please contact us. The fastest way to get your results is to register for My Chart. ° ° °IF you received an x-ray today, you will receive an invoice from [facility]. Please contact [facility] at [phone] with questions or concerns regarding your invoice.  ° °IF you received labwork today, you will receive an invoice from [facility]. Please contact [facility] at [phone] with questions or concerns regarding your invoice.  ° °Our billing staff will not be able to assist you with questions regarding bills from these companies. ° °You will be contacted with the lab results as soon as they are available. The fastest way to get your results is to activate your My Chart account. Instructions are located on the last page of this paperwork. If you have not heard from us regarding the results in 2 weeks, please contact this office. °  ° ° ° °

## 2019-01-25 NOTE — Progress Notes (Signed)
Established Patient Office Visit  Subjective:  Patient ID: Victoria Mitchell, female    DOB: January 31, 1946  Age: 73 y.o. MRN: [id number]  CC:  Chief Complaint  Patient presents with  . 3 month check up    on her chronic issues  . Medication Refill    states she has one refill left on all her medications    HPI Victoria Mitchell presents for   Hypertension: Patient here for follow-up of elevated blood pressure. She is exercising  and is adherent to low salt diet.  Blood pressure is well controlled at home. Cardiac symptoms none. Patient denies chest pain, chest pressure/discomfort, exertional chest pressure/discomfort, irregular heart beat and lower extremity edema.  Cardiovascular risk factors: diabetes mellitus, dyslipidemia and hypertension. Use of agents associated with hypertension: none. History of target organ damage: stroke. BP Readings from Last 3 Encounters:  01/25/19 128/78  10/26/18 140/78  09/22/18 118/70     Hypothyroidism: Patient presents for evaluation of thyroid function. Symptoms consist of denies fatigue, weight changes, heat/cold intolerance, bowel/skin changes or CVS symptoms.  Her thyroid levels fluctuate. She has not missed any doses. She takes it every day.  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 Hypercholesterolemia   . Hypertension   . Hypokalemia   . Stroke ([place])   . Thyroid disease    hypothyroid    No past surgical history on file.  Family History  Problem Relation Age of Onset  . Diabetes Father   . Hypertension Brother   . Heart disease Brother   . Heart attack Brother   . Heart disease Brother   . Hypertension Brother     Social History   Socioeconomic History  . Marital status: Married    Spouse name: Eduard Clos  .  Number of children: 0  . Years of education: Not on file  . Highest education level: 11th grade  Occupational History  . Not on file  Social Needs  . Financial resource strain: Not hard at all  . Food insecurity    Worry: Never true    Inability: Never true  . Transportation needs    Medical: No    Non-medical: No  Tobacco Use  . Smoking status: Former Smoker    Packs/day: 0.50    Years: 30.00    Pack years: 15.00  . Smokeless tobacco: Never Used  Substance and Sexual Activity  . Alcohol use: No  . Drug use: No  . Sexual activity: Yes  Lifestyle  . Physical activity    Days per week: 0 days    Minutes per session: 0 min  . Stress: Not at all  Relationships  . Social connections    Talks on phone: More than three times a week    Gets together: More than three times a week    Attends religious service: Never    Active member of club or organization: No    Attends meetings of clubs or organizations: Never    Relationship status: Married  . Intimate partner violence    Fear of current or ex partner: No    Emotionally abused: No    Physically abused: No    Forced sexual activity: No  Other Topics Concern  .  Not on file  Social History Narrative  . Not on file    Outpatient Medications Prior to Visit  Medication Sig Dispense Refill  . amLODipine (NORVASC) 5 MG tablet Take 1 tablet (5 mg total) by mouth daily. 90 tablet 1  . atorvastatin (LIPITOR) 10 MG tablet Take 1 tablet (10 mg total) by mouth daily. 90 tablet 3  . baclofen (LIORESAL) 10 MG tablet TAKE 1 TABLET BY MOUTH THREE TIMES DAILY 180 each 0  . carbamide peroxide (DEBROX) 6.5 % OTIC solution Place 5 drops into the right ear 2 (two) times daily. 15 mL 0  . labetalol (NORMODYNE) 100 MG tablet Take 1 tablet (100 mg total) by mouth 2 (two) times daily. 180 tablet 1  . levothyroxine (EUTHYROX) 50 MCG tablet TAKE 1 TABLET BY MOUTH ONCE DAILY BEFORE BREAKFAST 90 tablet 1  . losartan (COZAAR) 50 MG tablet Take 1  tablet (50 mg total) by mouth daily. 90 tablet 1   No facility-administered medications prior to visit.     No Known Allergies  ROS Review of Systems Review of Systems  Constitutional: Negative for activity change, +appetite change, chills and fever. Weight loss but attributes to lower appetite due to her brother being in hospice and her sister dying  HENT: Negative for congestion, nosebleeds, trouble swallowing and voice change.   Respiratory: Negative for cough, shortness of breath and wheezing.   Gastrointestinal: Negative for diarrhea, nausea and vomiting.  Genitourinary: Negative for difficulty urinating, dysuria, flank pain and hematuria.  Musculoskeletal: Negative for back pain, joint swelling and neck pain.  Neurological: Negative for dizziness, speech difficulty, light-headedness and numbness. Right sided weakness. See HPI. All other review of systems negative.     Objective:    Physical Exam  BP 128/78   Pulse 77   Temp 97.7 F (36.5 C) (Oral)   Resp 16   Ht 5\' 2"  (1.575 m)   Wt 127 lb 3.2 oz (57.7 kg)   SpO2 96%   BMI 23.27 kg/m  Wt Readings from Last 3 Encounters:  01/25/19 127 lb 3.2 oz (57.7 kg)  10/26/18 130 lb 9.6 oz (59.2 kg)  09/22/18 133 lb (60.3 kg)   Physical Exam  Constitutional: Oriented to person, place, and time. Appears well-developed and well-nourished.  HENT:  Head: Normocephalic and atraumatic.  Eyes: Conjunctivae and EOM are normal.  Cardiovascular: Normal rate, regular rhythm, normal heart sounds and intact distal pulses.  No murmur heard. Pulmonary/Chest: Effort normal and breath sounds normal. No stridor. No respiratory distress. Has no wheezes.  Neurological: Is alert and oriented to person, place, and time.  Skin: Skin is warm. Capillary refill takes less than 2 seconds.  Psychiatric: Has a normal mood and affect. Behavior is normal. Judgment and thought content normal.   right arm with muscular atrophy, decorticate position  clutched to her chest  Health Maintenance Due  Topic Date Due  . Hepatitis C Screening  Nov 17, 1945  . MAMMOGRAM  06/22/2011    There are no preventive care reminders to display for this patient.  Lab Results  Component Value Date   TSH 4.650 (H) 10/26/2018   Lab Results  Component Value Date   WBC 7.9 11/13/2017   HGB 14.3 11/13/2017   HCT 41.3 11/13/2017   MCV 92 11/13/2017   PLT 193 11/13/2017   Lab Results  Component Value Date   NA 141 10/26/2018   K 4.0 10/26/2018   CO2 19 (L) 10/26/2018   GLUCOSE 90 10/26/2018  BUN 8 10/26/2018   CREATININE 1.15 (H) 10/26/2018   BILITOT 0.9 10/26/2018   ALKPHOS 71 10/26/2018   AST 16 10/26/2018   ALT 9 10/26/2018   PROT 7.5 10/26/2018   ALBUMIN 4.2 10/26/2018   CALCIUM 9.2 10/26/2018   Lab Results  Component Value Date   CHOL 123 10/26/2018   Lab Results  Component Value Date   HDL 40 10/26/2018   Lab Results  Component Value Date   LDLCALC 63 10/26/2018   Lab Results  Component Value Date   TRIG 102 10/26/2018   Lab Results  Component Value Date   CHOLHDL 3.1 10/26/2018   Lab Results  Component Value Date   HGBA1C 6.0 (H) 11/13/2017   Lab Results  Component Value Date   TSH 4.650 (H) 10/26/2018      Assessment & Plan:   Problem List Items Addressed This Visit      Cardiovascular and Mediastinum   Essential hypertension - Primary bp well-controlled, pt compliant, no renal disease   Relevant Orders   Basic metabolic panel     Endocrine   Hypothyroidism  - goal is TSH between 1 and 5   Relevant Orders   TSH    Other Visit Diagnoses    Chronic kidney disease, unspecified CKD stage    - will continue to monitor bp and her renal disease has shown improvement   Relevant Orders   Basic metabolic panel   Encounter for hepatitis C screening test for low risk patient    -  Discussed screening   Relevant Orders   HCV Ab w/Rflx to Verification      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   Follow-up: No follow-ups on file.    Forrest Moron, MD

## 2019-01-26 LAB — BASIC METABOLIC PANEL
BUN/Creatinine Ratio: 8 — ABNORMAL LOW (ref 12–28)
BUN: 8 mg/dL (ref 8–27)
CO2: 17 mmol/L — ABNORMAL LOW (ref 20–29)
Calcium: 9.5 mg/dL (ref 8.7–10.3)
Chloride: 110 mmol/L — ABNORMAL HIGH (ref 96–106)
Creatinine, Ser: 1.05 mg/dL — ABNORMAL HIGH (ref 0.57–1.00)
GFR calc Af Amer: 62 mL/min/{1.73_m2} (ref >59)
GFR calc non Af Amer: 54 mL/min/{1.73_m2} — ABNORMAL LOW (ref >59)
Glucose: 108 mg/dL — ABNORMAL HIGH (ref 65–99)
Potassium: 3.8 mmol/L (ref 3.5–5.2)
Sodium: 143 mmol/L (ref 134–144)

## 2019-01-26 LAB — HCV INTERPRETATION

## 2019-01-26 LAB — HCV AB W/RFLX TO VERIFICATION: HCV Ab: <0.1 s/co ratio (ref 0.0–0.9)

## 2019-01-26 LAB — TSH: TSH: 1.19 u[IU]/mL (ref 0.450–4.500)

## 2019-01-28 ENCOUNTER — Encounter: Payer: Self-pay | Admitting: Radiology

## 2019-02-22 ENCOUNTER — Other Ambulatory Visit: Payer: Self-pay | Admitting: Family Medicine

## 2019-02-22 DIAGNOSIS — M62838 Other muscle spasm: Secondary | ICD-10-CM

## 2019-02-22 NOTE — Telephone Encounter (Signed)
Requested medication (s) are due for refill today: yes  Requested medication (s) are on the active medication list: yes  Last refill:  10/30/2018  Future visit scheduled: yes  Notes to clinic:  Refill cannot be delegated   Requested Prescriptions  Pending Prescriptions Disp Refills   baclofen (LIORESAL) 10 MG tablet [Pharmacy Med Name: Baclofen 10 MG Oral Tablet] 180 tablet 0    Sig: TAKE 1 TABLET BY MOUTH THREE TIMES DAILY     Not Delegated - Analgesics:  Muscle Relaxants Failed - 02/22/2019 11:08 AM      Failed - This refill cannot be delegated      Passed - Valid encounter within last 6 months    Recent Outpatient Visits          4 weeks ago Essential hypertension   [facility] at [facility], Zoe A, MD   3 months ago Hypothyroidism due to acquired atrophy of thyroid   [facility] at [facility], Arlie Solomons, MD   5 months ago Medicare annual wellness visit, subsequent   Primary [facility] at [place], MD   8 months ago Hypothyroidism due to acquired atrophy of thyroid   [facility] at [place], MD   8 months ago Hypothyroidism due to acquired atrophy of thyroid   [facility] at [place], MD      Future Appointments            In 5 months Forrest Moron, MD Primary [facility] at [place], [facility]

## 2019-04-28 ENCOUNTER — Other Ambulatory Visit: Payer: Self-pay | Admitting: Family Medicine

## 2019-04-28 DIAGNOSIS — E039 Hypothyroidism, unspecified: Secondary | ICD-10-CM

## 2019-05-04 ENCOUNTER — Other Ambulatory Visit: Payer: Self-pay | Admitting: Family Medicine

## 2019-05-04 DIAGNOSIS — I10 Essential (primary) hypertension: Secondary | ICD-10-CM

## 2019-05-18 ENCOUNTER — Other Ambulatory Visit: Payer: Self-pay | Admitting: Family Medicine

## 2019-05-18 DIAGNOSIS — I10 Essential (primary) hypertension: Secondary | ICD-10-CM

## 2019-06-01 ENCOUNTER — Other Ambulatory Visit: Payer: Self-pay | Admitting: Family Medicine

## 2019-06-01 DIAGNOSIS — I10 Essential (primary) hypertension: Secondary | ICD-10-CM

## 2019-07-26 ENCOUNTER — Other Ambulatory Visit: Payer: Self-pay

## 2019-07-26 ENCOUNTER — Encounter: Payer: Self-pay | Admitting: Family Medicine

## 2019-07-26 ENCOUNTER — Ambulatory Visit: Payer: Medicare HMO | Admitting: Family Medicine

## 2019-07-26 VITALS — BP 129/70 | HR 71 | Temp 98.2°F | Ht 62.0 in | Wt 128.0 lb

## 2019-07-26 DIAGNOSIS — N182 Chronic kidney disease, stage 2 (mild): Secondary | ICD-10-CM | POA: Diagnosis not present

## 2019-07-26 DIAGNOSIS — I10 Essential (primary) hypertension: Secondary | ICD-10-CM

## 2019-07-26 DIAGNOSIS — E034 Atrophy of thyroid (acquired): Secondary | ICD-10-CM

## 2019-07-26 DIAGNOSIS — E782 Mixed hyperlipidemia: Secondary | ICD-10-CM | POA: Diagnosis not present

## 2019-07-26 DIAGNOSIS — Z1211 Encounter for screening for malignant neoplasm of colon: Secondary | ICD-10-CM | POA: Diagnosis not present

## 2019-07-26 DIAGNOSIS — Z1231 Encounter for screening mammogram for malignant neoplasm of breast: Secondary | ICD-10-CM

## 2019-07-26 MED ORDER — ATORVASTATIN CALCIUM 10 MG PO TABS: 10.0000 mg | ORAL_TABLET | Freq: Every day | ORAL | 3 refills | Status: DC

## 2019-07-26 MED ORDER — LABETALOL HCL 100 MG PO TABS: 100.0000 mg | ORAL_TABLET | Freq: Two times a day (BID) | ORAL | 0 refills | Status: DC

## 2019-07-26 MED ORDER — AMLODIPINE BESYLATE 5 MG PO TABS: 5.0000 mg | ORAL_TABLET | Freq: Every day | ORAL | 0 refills | Status: DC

## 2019-07-26 MED ORDER — LOSARTAN POTASSIUM 50 MG PO TABS: 50.0000 mg | ORAL_TABLET | Freq: Every day | ORAL | 0 refills | Status: DC

## 2019-07-26 NOTE — Patient Instructions (Addendum)
We recommend that you schedule a mammogram for breast cancer screening. Typically, you do not need a referral to do this. Please contact a local imaging center to schedule your mammogram.   [facility] ([place]) - [phone] or [phone]     If you have lab work done today you will be contacted with your lab results within the next 2 weeks.  If you have not heard from [place] then please contact us. The fastest way to get your results is to register for My Chart.   IF you received an x-ray today, you will receive an invoice from [facility]. Please contact [facility] at [phone] with questions or concerns regarding your invoice.   IF you received labwork today, you will receive an invoice from [facility]. Please contact [facility] at [phone] with questions or concerns regarding your invoice.   Our billing staff will not be able to assist you with questions regarding bills from these companies.  You will be contacted with the lab results as soon as they are available. The fastest way to get your results is to activate your My Chart account. Instructions are located on the last page of this paperwork. If you have not heard from [place] regarding the results in 2 weeks, please contact this office.      Colorectal Cancer Screening  Colorectal cancer screening is a group of tests that are used to check for colorectal cancer before symptoms develop. Colorectal refers to the colon and rectum. The colon and rectum are located at the end of the digestive tract and carry bowel movements out of the body. Who should have screening? All adults starting at age 52 until age 18 should have screening. Your health care provider may recommend screening at age 44. You will have tests every 1-10 years, depending on your results and the type of screening test. You may have screening tests starting at an earlier age, or more frequently than other people, if you have any  of the following risk factors:  A personal or family history of colorectal cancer or abnormal growths (polyps).  Inflammatory bowel disease, such as ulcerative colitis or Crohn's disease.  A history of having radiation treatment to the abdomen or pelvic area for cancer.  Colorectal cancer symptoms, such as changes in bowel habits or blood in your stool.  A type of colon cancer syndrome that is passed from parent to child (hereditary), such as: ? Lynch syndrome. ? Familial adenomatous polyposis. ? Turcot syndrome. ? Peutz-Jeghers syndrome. Screening recommendations for adults who are 62-1 years old vary depending on health. How is screening done? There are several types of colorectal screening tests. You may have one or more of the following:  Guaiac-based fecal occult blood testing. For this test, a stool (feces) sample is checked for hidden (occult) blood, which could be a sign of colorectal cancer.  Fecal immunochemical test (FIT). For this test, a stool sample is checked for blood, which could be a sign of colorectal cancer.  Stool DNA test. For this test, a stool sample is checked for blood and changes in DNA that could lead to colorectal cancer.  Sigmoidoscopy. During this test, a thin, flexible tube with a camera on the end (sigmoidoscope) is used to examine the rectum and the lower colon.  Colonoscopy. During this test, a long, flexible tube with a camera on the end (colonoscope) is used to examine the entire colon and rectum. With a colonoscopy, it is possible to take a  sample of tissue (biopsy) and remove small polyps during the test.  Virtual colonoscopy. Instead of a colonoscope, this type of colonoscopy uses X-rays (CT scan) and computers to produce images of the colon and rectum. What are the benefits of screening? Screening reduces your risk for colorectal cancer and can help identify cancer at an early stage, when the cancer can be removed or treated more easily. It is  common for polyps to form in the lining of the colon, especially as you age. These polyps may be cancerous or become cancerous over time. Screening can identify these polyps. What are the risks of screening? Each screening test may have different risks.  Stool sample tests have fewer risks than other types of screening tests. However, you may need more tests to confirm results from a stool sample test.  Screening tests that involve X-rays expose you to low levels of radiation, which may slightly increase your cancer risk. The benefit of detecting cancer outweighs the slight increase in risk.  Screening tests such as sigmoidoscopy and colonoscopy may place you at risk for bleeding, intestinal damage, infection, or a reaction to medicines given during the exam. Talk with your health care provider to understand your risk for colorectal cancer and to make a screening plan that is right for you. Questions to ask your health care provider  When should I start colorectal cancer screening?  What is my risk for colorectal cancer?  How often do I need screening?  Which screening tests do I need?  How do I get my test results?  What do my results mean? Where to find more information Learn more about colorectal cancer screening from:  The [place]: www.cancer.org  The [employer]: www.cancer.gov Summary  Colorectal cancer screening is a group of tests used to check for colorectal cancer before symptoms develop.  Screening reduces your risk for colorectal cancer and can help identify cancer at an early stage, when the cancer can be removed or treated more easily.  All adults starting at age 21 until age 46 should have screening. Your health care provider may recommend screening at age 58.  You may have screening tests starting at an earlier age, or more frequently than other people, if you have certain risk factors.  Talk with your health care provider to  understand your risk for colorectal cancer and to make a screening plan that is right for you. This information is not intended to replace advice given to you by your health care provider. Make sure you discuss any questions you have with your health care provider. Document Revised: 07/22/2018 Document Reviewed: 01/01/2017 Elsevier Patient Education  [place].

## 2019-07-26 NOTE — Progress Notes (Signed)
Established Patient Office Visit  Subjective:  Patient ID: Victoria Mitchell, female    DOB: 1945-09-30  Age: 74 y.o. MRN: [id number]  CC:  Chief Complaint  Patient presents with  . Hypertension  . Hypothyroidism    HPI KAMICA FLORANCE presents for   Hypothyroidism: Patient presents for evaluation of thyroid function. Symptoms consist of denies fatigue, weight changes, heat/cold intolerance, bowel/skin changes or CVS symptoms.  Previous thyroid studies include TSH. The hypothyroidism is due to hypothyroidism. Lab Results  Component Value Date   TSH 1.190 01/25/2019     Hypertension: Patient here for follow-up of elevated blood pressure. She is exercising IN THE HOUSE WITH HER CANE BUT DENIES FALLS and is adherent to low salt diet.  Blood pressure is well controlled at home. Cardiac symptoms none. Patient denies chest pain, claudication, dyspnea, fatigue, lower extremity edema and near-syncope.  Cardiovascular risk factors: advanced age (older than 102 for men, 43 for women), dyslipidemia and hypertension. Use of agents associated with hypertension: none. History of target organ damage: stroke. Wt Readings from Last 3 Encounters:  07/26/19 128 lb (58.1 kg)  01/25/19 127 lb 3.2 oz (57.7 kg)  10/26/18 130 lb 9.6 oz (59.2 kg)    Wt Readings from Last 3 Encounters:  07/26/19 128 lb (58.1 kg)  01/25/19 127 lb 3.2 oz (57.7 kg)  10/26/18 130 lb 9.6 oz (59.2 kg)   Colon Cancer Screening she has never had a colonoscopy she denies blood in his stool, unexpected weight loss or pain with defecation No rectal itching she does not smoke she does not have a family history of colon cancer  Mammogram She reports that due to the stroke positioning her for the mammogram was painful.   Past Medical History:  Diagnosis Date  . Hypercholesterolemia   . Hypertension   . Hypokalemia   . Stroke ([place])   . Thyroid disease    hypothyroid    No past surgical history on file.  Family History    Problem Relation Age of Onset  . Diabetes Father   . Hypertension Brother   . Heart disease Brother   . Heart attack Brother   . Heart disease Brother   . Hypertension Brother     Social History   Socioeconomic History  . Marital status: Married    Spouse name: Eduard Clos  . Number of children: 0  . Years of education: Not on file  . Highest education level: 11th grade  Occupational History  . Not on file  Tobacco Use  . Smoking status: Former Smoker    Packs/day: 0.50    Years: 30.00    Pack years: 15.00  . Smokeless tobacco: Never Used  Substance and Sexual Activity  . Alcohol use: No  . Drug use: No  . Sexual activity: Yes  Other Topics Concern  . Not on file  Social History Narrative  . Not on file   Social Determinants of Health   Financial Resource Strain:   . Difficulty of Paying Living Expenses:   Food Insecurity:   . Worried About Charity fundraiser in the Last Year:   . Arboriculturist in the Last Year:   Transportation Needs:   . Film/video editor (Medical):   Marland Kitchen Lack of Transportation (Non-Medical):   Physical Activity:   . Days of Exercise per Week:   . Minutes of Exercise per Session:   Stress:   . Feeling of Stress :   Social Connections:   .  Frequency of Communication with Friends and Family:   . Frequency of Social Gatherings with Friends and Family:   . Attends Religious Services:   . Active Member of Clubs or Organizations:   . Attends Archivist Meetings:   Marland Kitchen Marital Status:   Intimate Partner Violence:   . Fear of Current or Ex-Partner:   . Emotionally Abused:   Marland Kitchen Physically Abused:   . Sexually Abused:     Outpatient Medications Prior to Visit  Medication Sig Dispense Refill  . amLODipine (NORVASC) 5 MG tablet Take 1 tablet by mouth once daily 90 tablet 0  . atorvastatin (LIPITOR) 10 MG tablet Take 1 tablet (10 mg total) by mouth daily. 90 tablet 3  . baclofen (LIORESAL) 10 MG tablet TAKE 1 TABLET BY MOUTH THREE  TIMES DAILY 180 tablet 1  . carbamide peroxide (DEBROX) 6.5 % OTIC solution Place 5 drops into the right ear 2 (two) times daily. 15 mL 0  . EUTHYROX 50 MCG tablet TAKE 1 TABLET BY MOUTH ONCE DAILY BEFORE BREAKFAST 90 tablet 0  . labetalol (NORMODYNE) 100 MG tablet Take 1 tablet by mouth twice daily 180 tablet 0  . losartan (COZAAR) 50 MG tablet Take 1 tablet by mouth once daily 90 tablet 0   No facility-administered medications prior to visit.    No Known Allergies  ROS Review of Systems Review of Systems  Constitutional: Negative for activity change, appetite change, chills and fever.  HENT: Negative for congestion, nosebleeds, trouble swallowing and voice change.   Respiratory: Negative for cough, shortness of breath and wheezing.   Gastrointestinal: Negative for diarrhea, nausea and vomiting.  Genitourinary: Negative for difficulty urinating, dysuria, flank pain and hematuria.  Musculoskeletal: Negative for back pain, joint swelling and neck pain.  Neurological: Negative for dizziness, speech difficulty, light-headedness, + CHRONIC numbness of the right arm from stroke See HPI. All other review of systems negative.     Objective:    Physical Exam  BP 129/70   Pulse 71   Temp 98.2 F (36.8 C)   Ht '5\' 2"'$  (1.575 m)   Wt 128 lb (58.1 kg)   SpO2 98%   BMI 23.41 kg/m  Wt Readings from Last 3 Encounters:  07/26/19 128 lb (58.1 kg)  01/25/19 127 lb 3.2 oz (57.7 kg)  10/26/18 130 lb 9.6 oz (59.2 kg)   Physical Exam  Constitutional: Oriented to person, place, and time. Appears well-developed and well-nourished.  HENT:  Head: Normocephalic and atraumatic.  Eyes: Conjunctivae and EOM are normal.  Cardiovascular: Normal rate, regular rhythm, normal heart sounds and intact distal pulses.  No murmur heard. Pulmonary/Chest: Effort normal and breath sounds normal. No stridor. No respiratory distress. Has no wheezes.  Neurological: Is alert and oriented to person, place, and time.   Skin: Skin is warm. Capillary refill takes less than 2 seconds.  Psychiatric: Has a normal mood and affect. Behavior is normal. Judgment and thought content normal.    Health Maintenance Due  Topic Date Due  . COLONOSCOPY  Never done  . MAMMOGRAM  06/22/2011  . PNA vac Low Risk Adult (1 of 2 - PCV13) 11/23/2012  . TETANUS/TDAP  05/13/2017    There are no preventive care reminders to display for this patient.  Lab Results  Component Value Date   TSH 1.190 01/25/2019   Lab Results  Component Value Date   WBC 7.9 11/13/2017   HGB 14.3 11/13/2017   HCT 41.3 11/13/2017   MCV 92 11/13/2017  PLT 193 11/13/2017   Lab Results  Component Value Date   NA 143 01/25/2019   K 3.8 01/25/2019   CO2 17 (L) 01/25/2019   GLUCOSE 108 (H) 01/25/2019   BUN 8 01/25/2019   CREATININE 1.05 (H) 01/25/2019   BILITOT 0.9 10/26/2018   ALKPHOS 71 10/26/2018   AST 16 10/26/2018   ALT 9 10/26/2018   PROT 7.5 10/26/2018   ALBUMIN 4.2 10/26/2018   CALCIUM 9.5 01/25/2019   Lab Results  Component Value Date   CHOL 123 10/26/2018   Lab Results  Component Value Date   HDL 40 10/26/2018   Lab Results  Component Value Date   LDLCALC 63 10/26/2018   Lab Results  Component Value Date   TRIG 102 10/26/2018   Lab Results  Component Value Date   CHOLHDL 3.1 10/26/2018   Lab Results  Component Value Date   HGBA1C 6.0 (H) 11/13/2017      Assessment & Plan:   Problem List Items Addressed This Visit      Cardiovascular and Mediastinum   Essential hypertension - Primary Patient's blood pressure is at goal of 139/89 or less. Condition is stable. Continue current medications and treatment plan. I recommend that you exercise for 30-45 minutes 5 days a week. I also recommend a balanced diet with fruits and vegetables every day, lean meats, and little fried foods. The DASH diet (you can find this online) is a good example of this.    Relevant Orders   CMP14+EGFR   Lipid panel      Endocrine   Hypothyroidism-  Thyroid stable Will monitor tsh Continue current dose of levothyroxine Discussed that any changes would require a earlier check Discussed that thyroid medication should always be taken on an empty stomach    Relevant Orders   TSH   Lipid panel     Other   MIXED HYPERLIPIDEMIA  - Discussed medications that affect lipids Reminded patient to avoid grapefruits Reviewed last 3 lipids Discussed current meds: statin, aspirin Advised dietary fiber and fish oil and ways to keep HDL high CAD prevention and reviewed side effects of statins   Relevant Orders   CMP14+EGFR   Lipid panel    Other Visit Diagnoses    Screening for colon cancer    - -discussed options for colon cancer screenings, reviewed family history, discussed options for testing.  -pt decided to proceed with cologuard    Relevant Orders   Cologuard   Encounter for screening mammogram for malignant neoplasm of breast       Relevant Orders   MM DIGITAL SCREENING BILATERAL   Stage 2 chronic kidney disease          No orders of the defined types were placed in this encounter.   Follow-up: Return in about 6 months (around 01/25/2020).    Forrest Moron, MD

## 2019-07-27 ENCOUNTER — Other Ambulatory Visit: Payer: Self-pay | Admitting: Family Medicine

## 2019-07-27 DIAGNOSIS — E039 Hypothyroidism, unspecified: Secondary | ICD-10-CM

## 2019-07-27 LAB — CMP14+EGFR
ALT: 10 IU/L (ref 0–32)
AST: 19 IU/L (ref 0–40)
Albumin/Globulin Ratio: 1.1 — ABNORMAL LOW (ref 1.2–2.2)
Albumin: 4 g/dL (ref 3.7–4.7)
Alkaline Phosphatase: 101 IU/L (ref 39–117)
BUN/Creatinine Ratio: 8 — ABNORMAL LOW (ref 12–28)
BUN: 8 mg/dL (ref 8–27)
Bilirubin Total: 0.4 mg/dL (ref 0.0–1.2)
CO2: 20 mmol/L (ref 20–29)
Calcium: 9.3 mg/dL (ref 8.7–10.3)
Chloride: 106 mmol/L (ref 96–106)
Creatinine, Ser: 1.04 mg/dL — ABNORMAL HIGH (ref 0.57–1.00)
GFR calc Af Amer: 62 mL/min/{1.73_m2} (ref >59)
GFR calc non Af Amer: 54 mL/min/{1.73_m2} — ABNORMAL LOW (ref >59)
Globulin, Total: 3.7 g/dL (ref 1.5–4.5)
Glucose: 95 mg/dL (ref 65–99)
Potassium: 4 mmol/L (ref 3.5–5.2)
Sodium: 141 mmol/L (ref 134–144)
Total Protein: 7.7 g/dL (ref 6.0–8.5)

## 2019-07-27 LAB — LIPID PANEL
Chol/HDL Ratio: 3.1 ratio (ref 0.0–4.4)
Cholesterol, Total: 119 mg/dL (ref 100–199)
HDL: 39 mg/dL — ABNORMAL LOW (ref >39)
LDL Chol Calc (NIH): 54 mg/dL (ref 0–99)
Triglycerides: 153 mg/dL — ABNORMAL HIGH (ref 0–149)
VLDL Cholesterol Cal: 26 mg/dL (ref 5–40)

## 2019-07-27 LAB — TSH: TSH: 1.56 u[IU]/mL (ref 0.450–4.500)

## 2019-07-29 NOTE — Progress Notes (Signed)
Sent patient letter due to multiple attempt calling with no answer

## 2019-09-28 ENCOUNTER — Telehealth: Payer: Self-pay | Admitting: *Deleted

## 2019-09-28 NOTE — Telephone Encounter (Signed)
Schedule awv  

## 2019-09-29 ENCOUNTER — Ambulatory Visit: Payer: Medicare HMO | Admitting: Registered Nurse

## 2019-09-29 ENCOUNTER — Other Ambulatory Visit: Payer: Self-pay | Admitting: *Deleted

## 2019-09-29 VITALS — BP 129/79 | Ht 62.0 in | Wt 128.0 lb

## 2019-09-29 DIAGNOSIS — Z0000 Encounter for general adult medical examination without abnormal findings: Secondary | ICD-10-CM

## 2019-09-29 DIAGNOSIS — I10 Essential (primary) hypertension: Secondary | ICD-10-CM

## 2019-09-29 DIAGNOSIS — E039 Hypothyroidism, unspecified: Secondary | ICD-10-CM

## 2019-09-29 DIAGNOSIS — E782 Mixed hyperlipidemia: Secondary | ICD-10-CM

## 2019-09-29 MED ORDER — AMLODIPINE BESYLATE 5 MG PO TABS: 5.0000 mg | ORAL_TABLET | Freq: Every day | ORAL | 0 refills | Status: DC

## 2019-09-29 MED ORDER — LEVOTHYROXINE SODIUM 50 MCG PO TABS: ORAL_TABLET | ORAL | 0 refills | Status: DC

## 2019-09-29 MED ORDER — LOSARTAN POTASSIUM 50 MG PO TABS: 50.0000 mg | ORAL_TABLET | Freq: Every day | ORAL | 0 refills | Status: DC

## 2019-09-29 NOTE — Progress Notes (Signed)
Presents today for [employer] Visit   Date of last exam: 07-26-2019  Interpreter used for this visit? No  I connected with  Victoria Mitchell on 09/29/19 by a telephone  and verified that I am speaking with the correct person using two identifiers.   I discussed the limitations of evaluation and management by telemedicine. The patient expressed understanding and agreed to proceed.   Patient Care Team: Forrest Moron, MD as PCP - General (Internal Medicine)   Other items to address today:   Discussed immunizations Discussed Eye/Dental    Other Screening: Last screening for diabetes:07/26/2019 Last lipid screening:07/26/2019  ADVANCE DIRECTIVES: Discussed: yes On File: no Materials Provided: yes  Immunization status:  Immunization History  Administered Date(s) Administered  . H1N1 09/08/2008  . Influenza Whole 05/14/2007, 01/27/2008  . PFIZER SARS-COV-2 Vaccination 07/21/2019, 07/22/2019  . Pneumococcal Polysaccharide-23 01/27/2008  . Td 05/14/2007     Health Maintenance Due  Topic Date Due  . COLONOSCOPY  Never done  . MAMMOGRAM  06/22/2011  . PNA vac Low Risk Adult (1 of 2 - PCV13) 11/23/2012  . TETANUS/TDAP  05/13/2017     Functional Status Survey: Is the patient deaf or have difficulty hearing?: No Does the patient have difficulty seeing, even when wearing glasses/contacts?: No Does the patient have difficulty concentrating, remembering, or making decisions?: No Does the patient have difficulty walking or climbing stairs?: No Does the patient have difficulty dressing or bathing?: No Does the patient have difficulty doing errands alone such as visiting a doctor's office or shopping?: No   6CIT Screen 09/29/2019 09/22/2018 05/29/2017  What Year? 0 points 0 points 0 points  What month? 0 points 0 points 0 points  What time? 0 points 0 points 0 points  Count back from 20 0 points 0 points 0 points  Months in reverse 0 points 0 points 0  points  Repeat phrase 0 points 0 points 2 points  Total Score 0 0 2        Office Visit from 09/22/2018 in [facility]  AUDIT-C Score 0       Home Environment:   Lives in one story home with husband Can climb stairs takes it very slow No scattered rugs Yes grab bars Adequate lighting/ no clutter   Patient Active Problem List   Diagnosis Date Noted  . GOUT 01/10/2010  . 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 KNEE PAIN, RIGHT 12/19/2009  . BREAST MASS, LEFT 06/16/2009  . INSOMNIA 06/16/2009  . WART, VIRAL 12/22/2008  . OSTEOPENIA 11/02/2008  . Hypothyroidism 10/30/2008  . GOITER, MULTINODULAR 10/21/2008  . DYSPHAGIA UNSPECIFIED 10/21/2008  . POSTMENOPAUSAL STATUS 10/21/2008  . PALPITATIONS 09/02/2008  . EPIGASTRIC PAIN 09/02/2008  . EDEMA, ANKLES 09/11/2007  . RENAL INSUFFICIENCY 08/21/2007  . FIBROIDS, UTERUS 07/21/2007  . 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 Essential hypertension 05/14/2007  . ALLERGIC RHINITIS 05/14/2007  . HYPERTHYROIDISM, HX OF 04/02/2002  . STROKE, HX OF 09/13/1997     Past Medical History:  Diagnosis Date  . Hypercholesterolemia   . Hypertension   . Hypokalemia   . Stroke ([place])   . Thyroid disease    hypothyroid     History reviewed. No pertinent surgical history.   Family History  Problem Relation Age of Onset  . Diabetes Father   . Hypertension Brother   . Heart disease Brother   . Heart attack Brother   . Heart disease Brother   .  Hypertension Brother      Social History   Socioeconomic History  . Marital status: Married    Spouse name: Eduard Clos  . Number of children: 0  . Years of education: Not on file  . Highest education level: 11th grade  Occupational History  . Not on file  Tobacco Use  . Smoking status: Former Smoker    Packs/day: 0.50    Years: 30.00    Pack years: 15.00  . Smokeless tobacco: Never Used  Vaping Use  . Vaping Use: Never used  Substance and Sexual Activity  . Alcohol  use: No  . Drug use: No  . Sexual activity: Yes  Other Topics Concern  . Not on file  Social History Narrative  . Not on file   Social Determinants of Health   Financial Resource Strain:   . Difficulty of Paying Living Expenses:   Food Insecurity:   . Worried About Charity fundraiser in the Last Year:   . Arboriculturist in the Last Year:   Transportation Needs:   . Film/video editor (Medical):   Marland Kitchen Lack of Transportation (Non-Medical):   Physical Activity:   . Days of Exercise per Week:   . Minutes of Exercise per Session:   Stress:   . Feeling of Stress :   Social Connections:   . Frequency of Communication with Friends and Family:   . Frequency of Social Gatherings with Friends and Family:   . Attends Religious Services:   . Active Member of Clubs or Organizations:   . Attends Archivist Meetings:   Marland Kitchen Marital Status:   Intimate Partner Violence:   . Fear of Current or Ex-Partner:   . Emotionally Abused:   Marland Kitchen Physically Abused:   . Sexually Abused:      No Known Allergies   Prior to Admission medications   Medication Sig Start Date End Date Taking? Authorizing Provider  amLODipine (NORVASC) 5 MG tablet Take 1 tablet (5 mg total) by mouth daily. 07/26/19  Yes Delia Chimes A, MD  atorvastatin (LIPITOR) 10 MG tablet Take 1 tablet (10 mg total) by mouth daily. 07/26/19  Yes Stallings, Zoe A, MD  baclofen (LIORESAL) 10 MG tablet TAKE 1 TABLET BY MOUTH THREE TIMES DAILY 02/27/19  Yes Stallings, Zoe A, MD  EUTHYROX 50 MCG tablet TAKE 1 TABLET BY MOUTH ONCE DAILY BEFORE BREAKFAST 07/27/19  Yes Stallings, Zoe A, MD  labetalol (NORMODYNE) 100 MG tablet Take 1 tablet (100 mg total) by mouth 2 (two) times daily. 07/26/19  Yes Delia Chimes A, MD  losartan (COZAAR) 50 MG tablet Take 1 tablet (50 mg total) by mouth daily. 07/26/19  Yes Stallings, Zoe A, MD  carbamide peroxide (DEBROX) 6.5 % OTIC solution Place 5 drops into the right ear 2 (two) times daily. Patient  not taking: Reported on 09/29/2019 06/11/18   Forrest Moron, MD     Depression screen [facility] 2/9 09/29/2019 01/25/2019 10/26/2018 09/22/2018 06/17/2018  Decreased Interest 0 0 0 0 0  Down, Depressed, Hopeless 0 0 0 0 0  PHQ - 2 Score 0 0 0 0 0     Fall Risk  09/29/2019 07/26/2019 01/25/2019 10/26/2018 09/22/2018  Falls in the past year? 0 0 0 0 0  Number falls in past yr: 0 0 - - 0  Injury with Fall? 0 0 - - 0  Risk for fall due to : - - - - -  Follow up Falls evaluation completed;Education  provided Falls evaluation completed - - Falls evaluation completed;Education provided;Falls prevention discussed      PHYSICAL EXAM: BP 129/79 Comment: taken from a previous visit  Ht 5\' 2"  (1.575 m)   Wt 128 lb (58.1 kg)   BMI 23.41 kg/m    Wt Readings from Last 3 Encounters:  09/29/19 128 lb (58.1 kg)  07/26/19 128 lb (58.1 kg)  01/25/19 127 lb 3.2 oz (57.7 kg)       Education/Counseling provided regarding diet and exercise, prevention of chronic diseases, smoking/tobacco cessation, if applicable, and reviewed "Covered Medicare Preventive Services."

## 2019-09-29 NOTE — Patient Instructions (Addendum)
Thank you for taking time to come for your Medicare Wellness Visit. I appreciate your ongoing commitment to your health goals. Please review the following plan we discussed and let me know if I can assist you in the future.  Desirea Mizrahi LPN  Preventive Care 72 Years and Older, Female Preventive care refers to lifestyle choices and visits with your health care provider that can promote health and wellness. This includes:  A yearly physical exam. This is also called an annual well check.  Regular dental and eye exams.  Immunizations.  Screening for certain conditions.  Healthy lifestyle choices, such as diet and exercise. What can I expect for my preventive care visit? Physical exam Your health care provider will check:  Height and weight. These may be used to calculate body mass index (BMI), which is a measurement that tells if you are at a healthy weight.  Heart rate and blood pressure.  Your skin for abnormal spots. Counseling Your health care provider may ask you questions about:  Alcohol, tobacco, and drug use.  Emotional well-being.  Home and relationship well-being.  Sexual activity.  Eating habits.  History of falls.  Memory and ability to understand (cognition).  Work and work environment.  Pregnancy and menstrual history. What immunizations do I need?  Influenza (flu) vaccine  This is recommended every year. Tetanus, diphtheria, and pertussis (Tdap) vaccine  You may need a Td booster every 10 years. Varicella (chickenpox) vaccine  You may need this vaccine if you have not already been vaccinated. Zoster (shingles) vaccine  You may need this after age 60. Pneumococcal conjugate (PCV13) vaccine  One dose is recommended after age 65. Pneumococcal polysaccharide (PPSV23) vaccine  One dose is recommended after age 65. Measles, mumps, and rubella (MMR) vaccine  You may need at least one dose of MMR if you were born in 1957 or later. You may also  need a second dose. Meningococcal conjugate (MenACWY) vaccine  You may need this if you have certain conditions. Hepatitis A vaccine  You may need this if you have certain conditions or if you travel or work in places where you may be exposed to hepatitis A. Hepatitis B vaccine  You may need this if you have certain conditions or if you travel or work in places where you may be exposed to hepatitis B. Haemophilus influenzae type b (Hib) vaccine  You may need this if you have certain conditions. You may receive vaccines as individual doses or as more than one vaccine together in one shot (combination vaccines). Talk with your health care provider about the risks and benefits of combination vaccines. What tests do I need? Blood tests  Lipid and cholesterol levels. These may be checked every 5 years, or more frequently depending on your overall health.  Hepatitis C test.  Hepatitis B test. Screening  Lung cancer screening. You may have this screening every year starting at age 55 if you have a 30-pack-year history of smoking and currently smoke or have quit within the past 15 years.  Colorectal cancer screening. All adults should have this screening starting at age 50 and continuing until age 75. Your health care provider may recommend screening at age 45 if you are at increased risk. You will have tests every 1-10 years, depending on your results and the type of screening test.  Diabetes screening. This is done by checking your blood sugar (glucose) after you have not eaten for a while (fasting). You may have this done every 1-3   years.  Mammogram. This may be done every 1-2 years. Talk with your health care provider about how often you should have regular mammograms.  BRCA-related cancer screening. This may be done if you have a family history of breast, ovarian, tubal, or peritoneal cancers. Other tests  Sexually transmitted disease (STD) testing.  Bone density scan. This is done  to screen for osteoporosis. You may have this done starting at age 41. Follow these instructions at home: Eating and drinking  Eat a diet that includes fresh fruits and vegetables, whole grains, lean protein, and low-fat dairy products. Limit your intake of foods with high amounts of sugar, saturated fats, and salt.  Take vitamin and mineral supplements as recommended by your health care provider.  Do not drink alcohol if your health care provider tells you not to drink.  If you drink alcohol: ? Limit how much you have to 0-1 drink a day. ? Be aware of how much alcohol is in your drink. In the [place] one drink equals one 12 oz bottle of beer (355 mL), one 5 oz glass of wine (148 mL), or one 1 oz glass of hard liquor (44 mL). Lifestyle  Take daily care of your teeth and gums.  Stay active. Exercise for at least 30 minutes on 5 or more days each week.  Do not use any products that contain nicotine or tobacco, such as cigarettes, e-cigarettes, and chewing tobacco. If you need help quitting, ask your health care provider.  If you are sexually active, practice safe sex. Use a condom or other form of protection in order to prevent STIs (sexually transmitted infections).  Talk with your health care provider about taking a low-dose aspirin or statin. What's next?  Go to your health care provider once a year for a well check visit.  Ask your health care provider how often you should have your eyes and teeth checked.  Stay up to date on all vaccines. This information is not intended to replace advice given to you by your health care provider. Make sure you discuss any questions you have with your health care provider. Document Revised: 03/26/2018 Document Reviewed: 03/26/2018 Elsevier Patient Education  2020 [employer].

## 2019-11-08 ENCOUNTER — Telehealth: Payer: Self-pay | Admitting: Emergency Medicine

## 2019-11-08 ENCOUNTER — Other Ambulatory Visit: Payer: Self-pay | Admitting: Family Medicine

## 2019-11-08 DIAGNOSIS — I10 Essential (primary) hypertension: Secondary | ICD-10-CM

## 2019-11-08 DIAGNOSIS — M62838 Other muscle spasm: Secondary | ICD-10-CM

## 2019-11-08 NOTE — Telephone Encounter (Signed)
Requested medication (s) are due for refill today: yes  Requested medication (s) are on the active medication list: yes  Last refill:  02/22/2019  Future visit scheduled: no  Notes to clinic:  medication last filled by by Dr. Bridget Hartshorn    Requested Prescriptions  Pending Prescriptions Disp Refills   labetalol (NORMODYNE) 100 MG tablet 180 tablet 0    Sig: Take 1 tablet (100 mg total) by mouth 2 (two) times daily.      Cardiovascular:  Beta Blockers Passed - 11/08/2019 10:02 AM      Passed - Last BP in normal range    BP Readings from Last 1 Encounters:  09/29/19 129/79          Passed - Last Heart Rate in normal range    Pulse Readings from Last 1 Encounters:  07/26/19 71          Passed - Valid encounter within last 6 months    Recent Outpatient Visits           3 months ago Essential hypertension   [facility] Care at [facility], Arlie Solomons, MD   9 months ago Essential hypertension   Primary Care at [facility], [place] A, MD   1 year ago Hypothyroidism due to acquired atrophy of thyroid   [facility] at [facility], Arlie Solomons, MD   1 year ago Medicare annual wellness visit, subsequent   Primary Care at [facility], [place] A, MD   1 year ago Hypothyroidism due to acquired atrophy of thyroid   Primary Care at [facility], [place] A, MD                baclofen (LIORESAL) 10 MG tablet 180 tablet 1    Sig: Take 1 tablet (10 mg total) by mouth 3 (three) times daily.      Not Delegated - Analgesics:  Muscle Relaxants Failed - 11/08/2019 10:02 AM      Failed - This refill cannot be delegated      Passed - Valid encounter within last 6 months    Recent Outpatient Visits           3 months ago Essential hypertension   Primary Care at [facility], Arlie Solomons, MD   9 months ago Essential hypertension   Primary Care at [facility], [place] A, MD   1 year ago Hypothyroidism due to acquired atrophy of thyroid   Primary Care at [facility], Arlie Solomons, MD   1  year ago Medicare annual wellness visit, subsequent   Primary Care at [facility], [place] A, MD   1 year ago Hypothyroidism due to acquired atrophy of thyroid   Primary Care at [facility], Arlie Solomons, MD

## 2019-11-08 NOTE — Telephone Encounter (Signed)
error 

## 2019-11-08 NOTE — Telephone Encounter (Signed)
Medication Refill - Medication: labetalol, baclofen  Has the patient contacted their pharmacy? Yes.  PT states that she is out of medication. Please advise.  (Agent: If no, request that the patient contact the pharmacy for the refill.) (Agent: If yes, when and what did the pharmacy advise?)  Preferred Pharmacy (with phone number or street name):  [facility], [address] [place]  [place] [address]  Phone: [phone] Fax: [phone]  Hours: Not open 24 hours     Agent: Please be advised that RX refills may take up to 3 business days. We ask that you follow-up with your pharmacy.

## 2019-11-08 NOTE — Telephone Encounter (Signed)
Pt has 1st available appt 01/03/2020 at 1:10 and needs refills on  What is the name of the medication?amLODipine (NORVASC) 5 MG tablet [[id number] losartan (COZAAR) 50 MG tablet [[id number]] labetalol (NORMODYNE) 100 MG tablet [[id number] baclofen (LIORESAL) 10 MG tablet [[id number]      Have you contacted your pharmacy to request a refill? y  Which pharmacy would you like this sent to?  [facility], [place] [place], [place] [address]  Phone:  [phone] Fax:  [phone]  DEA #:  --   Patient notified that their request is being sent to the clinical staff for review and that they should receive a call once it is complete. If they do not receive a call within 72 hours they can check with their pharmacy or our office.

## 2019-11-09 ENCOUNTER — Other Ambulatory Visit: Payer: Self-pay

## 2019-11-09 DIAGNOSIS — I10 Essential (primary) hypertension: Secondary | ICD-10-CM

## 2019-11-09 DIAGNOSIS — M62838 Other muscle spasm: Secondary | ICD-10-CM

## 2019-11-09 MED ORDER — LOSARTAN POTASSIUM 50 MG PO TABS: 50.0000 mg | ORAL_TABLET | Freq: Every day | ORAL | 0 refills | Status: DC

## 2019-11-09 MED ORDER — LABETALOL HCL 100 MG PO TABS: 100.0000 mg | ORAL_TABLET | Freq: Two times a day (BID) | ORAL | 0 refills | Status: DC

## 2019-11-09 MED ORDER — AMLODIPINE BESYLATE 5 MG PO TABS: 5.0000 mg | ORAL_TABLET | Freq: Every day | ORAL | 0 refills | Status: DC

## 2019-11-09 MED ORDER — BACLOFEN 10 MG PO TABS: 10.0000 mg | ORAL_TABLET | Freq: Three times a day (TID) | ORAL | 1 refills | Status: DC

## 2019-11-09 NOTE — Progress Notes (Addendum)
Rx refill for Labetalol and Baclofen failed to transmit.  Will route to office to resend.

## 2019-11-11 ENCOUNTER — Ambulatory Visit: Payer: Self-pay | Admitting: *Deleted

## 2019-11-11 ENCOUNTER — Telehealth: Payer: Self-pay | Admitting: *Deleted

## 2019-11-11 DIAGNOSIS — I10 Essential (primary) hypertension: Secondary | ICD-10-CM

## 2019-11-11 DIAGNOSIS — M62838 Other muscle spasm: Secondary | ICD-10-CM

## 2019-11-11 MED ORDER — AMLODIPINE BESYLATE 5 MG PO TABS: 5.0000 mg | ORAL_TABLET | Freq: Every day | ORAL | 0 refills | Status: DC

## 2019-11-11 MED ORDER — BACLOFEN 10 MG PO TABS: 10.0000 mg | ORAL_TABLET | Freq: Three times a day (TID) | ORAL | 1 refills | Status: DC

## 2019-11-11 MED ORDER — LABETALOL HCL 100 MG PO TABS: 100.0000 mg | ORAL_TABLET | Freq: Two times a day (BID) | ORAL | 0 refills | Status: DC

## 2019-11-11 NOTE — Telephone Encounter (Signed)
I called pt and let her know her medications she needed refilled had been resubmitted to her pharmacy and verified that they were transmitted.   I let her know to check with her pharmacy later today and they should have her medications ready for her.   I apologized to her for her prescriptions failing to transmit into the pharmacy but I have straightened it out now.  She thanked for looking into this and getting it straight.

## 2019-11-11 NOTE — Telephone Encounter (Signed)
Pt called letting me know her pharmacy, the [facility] on [address] ([place]) did not receive her prescriptions for her amlodipine, baclofen, and labetalol. It looks like the transmission did not go through on 11/09/2019.  I have resent the prescriptions to the above pharmacy.  I let pt know to check with her pharmacy later today.

## 2020-01-03 ENCOUNTER — Ambulatory Visit (INDEPENDENT_AMBULATORY_CARE_PROVIDER_SITE_OTHER): Payer: Medicare HMO | Admitting: Registered Nurse

## 2020-01-03 ENCOUNTER — Encounter: Payer: Self-pay | Admitting: Registered Nurse

## 2020-01-03 ENCOUNTER — Other Ambulatory Visit: Payer: Self-pay

## 2020-01-03 VITALS — BP 131/72 | HR 78 | Temp 98.6°F | Ht 62.0 in | Wt 127.0 lb

## 2020-01-03 DIAGNOSIS — I10 Essential (primary) hypertension: Secondary | ICD-10-CM | POA: Diagnosis not present

## 2020-01-03 DIAGNOSIS — E782 Mixed hyperlipidemia: Secondary | ICD-10-CM

## 2020-01-03 DIAGNOSIS — E039 Hypothyroidism, unspecified: Secondary | ICD-10-CM

## 2020-01-03 DIAGNOSIS — H269 Unspecified cataract: Secondary | ICD-10-CM

## 2020-01-03 HISTORY — DX: Unspecified cataract: H26.9

## 2020-01-03 NOTE — Progress Notes (Signed)
Established Patient Office Visit  Subjective:  Patient ID: Victoria Mitchell, female    DOB: 06-07-1945  Age: 74 y.o. MRN: [id number]  CC:  Chief Complaint  Patient presents with   transfer of care    pt states no current concerns / has recent dx of cataract pending surgery    HPI Victoria Mitchell presents for visit to est care Formerly pt of Dr. Nolon Rod  Hx of mixed hld, htn, cva (1999), hypothyroidism, hypokalemia. All under control. No new symptoms or concerns.   Hx of cataracts - managed through ophthalmology - has upcoming surgery. Being seen for surgical prep tomorrow morning.   No other concerns or complaints. Up to date on wellness visit (June 2021).   Past Medical History:  Diagnosis Date   Cataract 01/03/2020   Hypercholesterolemia    Hypertension    Hypokalemia    Stroke [facility])    Thyroid disease    hypothyroid    History reviewed. No pertinent surgical history.  Family History  Problem Relation Age of Onset   Tuberculosis Father    Hypertension Brother    Heart disease Brother    Heart attack Brother    Heart disease Brother    Hypertension Brother     Social History   Socioeconomic History   Marital status: Married    Spouse name: Charlie   Number of children: 0   Years of education: Not on file   Highest education level: 11th grade  Occupational History   Not on file  Tobacco Use   Smoking status: Former Smoker    Packs/day: 0.50    Years: 30.00    Pack years: 15.00   Smokeless tobacco: Never Used  Scientific laboratory technician Use: Never used  Substance and Sexual Activity   Alcohol use: No   Drug use: No   Sexual activity: Yes  Other Topics Concern   Not on file  Social History Narrative   Not on file   Social Determinants of Health   Financial Resource Strain:    Difficulty of Paying Living Expenses: Not on file  Food Insecurity:    Worried About Charity fundraiser in the Last Year: Not on file   [employer] of  Food in the Last Year: Not on file  Transportation Needs:    Lack of Transportation (Medical): Not on file   Lack of Transportation (Non-Medical): Not on file  Physical Activity:    Days of Exercise per Week: Not on file   Minutes of Exercise per Session: Not on file  Stress:    Feeling of Stress : Not on file  Social Connections:    Frequency of Communication with Friends and Family: Not on file   Frequency of Social Gatherings with Friends and Family: Not on file   Attends Religious Services: Not on file   Active Member of Clubs or Organizations: Not on file   Attends Archivist Meetings: Not on file   Marital Status: Not on file  Intimate Partner Violence:    Fear of Current or Ex-Partner: Not on file   Emotionally Abused: Not on file   Physically Abused: Not on file   Sexually Abused: Not on file    Outpatient Medications Prior to Visit  Medication Sig Dispense Refill   amLODipine (NORVASC) 5 MG tablet Take 1 tablet (5 mg total) by mouth daily. 90 tablet 0   atorvastatin (LIPITOR) 10 MG tablet Take 1 tablet (10 mg total)  by mouth daily. 90 tablet 3   baclofen (LIORESAL) 10 MG tablet Take 1 tablet (10 mg total) by mouth 3 (three) times daily. 180 tablet 1   carbamide peroxide (DEBROX) 6.5 % OTIC solution Place 5 drops into the right ear 2 (two) times daily. 15 mL 0   labetalol (NORMODYNE) 100 MG tablet Take 1 tablet (100 mg total) by mouth 2 (two) times daily. 180 tablet 0   levothyroxine (EUTHYROX) 50 MCG tablet TAKE 1 TABLET BY MOUTH ONCE DAILY BEFORE BREAKFAST 90 tablet 0   losartan (COZAAR) 50 MG tablet Take 1 tablet (50 mg total) by mouth daily. 90 tablet 0   No facility-administered medications prior to visit.    No Known Allergies  ROS Review of Systems  Constitutional: Negative.   HENT: Negative.   Eyes: Negative.   Respiratory: Negative.   Cardiovascular: Negative.   Gastrointestinal: Negative.   Genitourinary: Negative.     Musculoskeletal: Negative.   Skin: Negative.   Neurological: Negative.   Psychiatric/Behavioral: Negative.       Objective:    Physical Exam Vitals and nursing note reviewed.  Constitutional:      General: She is not in acute distress.    Appearance: Normal appearance. She is normal weight. She is not ill-appearing, toxic-appearing or diaphoretic.  Cardiovascular:     Rate and Rhythm: Normal rate and regular rhythm.     Heart sounds: Normal heart sounds. No murmur heard.  No friction rub. No gallop.   Pulmonary:     Effort: Pulmonary effort is normal. No respiratory distress.     Breath sounds: Normal breath sounds. No stridor. No wheezing, rhonchi or rales.  Chest:     Chest wall: No tenderness.  Skin:    General: Skin is warm and dry.  Neurological:     General: No focal deficit present.     Mental Status: She is alert and oriented to person, place, and time. Mental status is at baseline.  Psychiatric:        Mood and Affect: Mood normal.        Behavior: Behavior normal.        Thought Content: Thought content normal.        Judgment: Judgment normal.     BP 131/72    Pulse 78    Temp 98.6 F (37 C)    Ht 5\' 2"  (1.575 m)    Wt 127 lb (57.6 kg)    SpO2 97%    BMI 23.23 kg/m  Wt Readings from Last 3 Encounters:  01/03/20 127 lb (57.6 kg)  09/29/19 128 lb (58.1 kg)  07/26/19 128 lb (58.1 kg)     Health Maintenance Due  Topic Date Due   MAMMOGRAM  06/22/2011    There are no preventive care reminders to display for this patient.  Lab Results  Component Value Date   TSH 1.560 07/26/2019   Lab Results  Component Value Date   WBC 7.9 11/13/2017   HGB 14.3 11/13/2017   HCT 41.3 11/13/2017   MCV 92 11/13/2017   PLT 193 11/13/2017   Lab Results  Component Value Date   NA 141 07/26/2019   K 4.0 07/26/2019   CO2 20 07/26/2019   GLUCOSE 95 07/26/2019   BUN 8 07/26/2019   CREATININE 1.04 (H) 07/26/2019   BILITOT 0.4 07/26/2019   ALKPHOS 101 07/26/2019    AST 19 07/26/2019   ALT 10 07/26/2019   PROT 7.7 07/26/2019   ALBUMIN 4.0  07/26/2019   CALCIUM 9.3 07/26/2019   Lab Results  Component Value Date   CHOL 119 07/26/2019   Lab Results  Component Value Date   HDL 39 (L) 07/26/2019   Lab Results  Component Value Date   LDLCALC 54 07/26/2019   Lab Results  Component Value Date   TRIG 153 (H) 07/26/2019   Lab Results  Component Value Date   CHOLHDL 3.1 07/26/2019   Lab Results  Component Value Date   HGBA1C 6.0 (H) 11/13/2017      Assessment & Plan:   Problem List Items Addressed This Visit      Cardiovascular and Mediastinum   Essential hypertension - Primary   Relevant Orders   Comprehensive metabolic panel     Endocrine   Hypothyroidism   Relevant Orders   TSH     Other   MIXED HYPERLIPIDEMIA   Relevant Orders   Lipid panel      No orders of the defined types were placed in this encounter.   Follow-up: No follow-ups on file.   PLAN  Pt does not need refills at this time - but ok to provide six months worth when due  Labs collected, will follow up as warranted  Return in 6 mo for follow up on chronic conditions  Patient encouraged to call clinic with any questions, comments, or concerns.  Maximiano Coss, NP

## 2020-01-03 NOTE — Patient Instructions (Signed)
° ° ° °  If you have lab work done today you will be contacted with your lab results within the next 2 weeks.  If you have not heard from us then please contact us. The fastest way to get your results is to register for My Chart. ° ° °IF you received an x-ray today, you will receive an invoice from [facility]. Please contact [facility] at [phone] with questions or concerns regarding your invoice.  ° °IF you received labwork today, you will receive an invoice from [facility]. Please contact [facility] at [phone] with questions or concerns regarding your invoice.  ° °Our billing staff will not be able to assist you with questions regarding bills from these companies. ° °You will be contacted with the lab results as soon as they are available. The fastest way to get your results is to activate your My Chart account. Instructions are located on the last page of this paperwork. If you have not heard from us regarding the results in 2 weeks, please contact this office. °  ° ° ° °

## 2020-01-04 DIAGNOSIS — H25812 Combined forms of age-related cataract, left eye: Secondary | ICD-10-CM | POA: Diagnosis not present

## 2020-01-04 DIAGNOSIS — H25811 Combined forms of age-related cataract, right eye: Secondary | ICD-10-CM | POA: Diagnosis not present

## 2020-01-04 DIAGNOSIS — Z01818 Encounter for other preprocedural examination: Secondary | ICD-10-CM | POA: Diagnosis not present

## 2020-01-04 DIAGNOSIS — H25813 Combined forms of age-related cataract, bilateral: Secondary | ICD-10-CM | POA: Diagnosis not present

## 2020-01-04 LAB — COMPREHENSIVE METABOLIC PANEL
ALT: 12 IU/L (ref 0–32)
AST: 18 IU/L (ref 0–40)
Albumin/Globulin Ratio: 1.2 (ref 1.2–2.2)
Albumin: 4.1 g/dL (ref 3.7–4.7)
Alkaline Phosphatase: 78 IU/L (ref 44–121)
BUN/Creatinine Ratio: 8 — ABNORMAL LOW (ref 12–28)
BUN: 8 mg/dL (ref 8–27)
Bilirubin Total: 0.6 mg/dL (ref 0.0–1.2)
CO2: 21 mmol/L (ref 20–29)
Calcium: 9.7 mg/dL (ref 8.7–10.3)
Chloride: 105 mmol/L (ref 96–106)
Creatinine, Ser: 1.05 mg/dL — ABNORMAL HIGH (ref 0.57–1.00)
GFR calc Af Amer: 61 mL/min/{1.73_m2} (ref >59)
GFR calc non Af Amer: 53 mL/min/{1.73_m2} — ABNORMAL LOW (ref >59)
Globulin, Total: 3.3 g/dL (ref 1.5–4.5)
Glucose: 99 mg/dL (ref 65–99)
Potassium: 4.1 mmol/L (ref 3.5–5.2)
Sodium: 140 mmol/L (ref 134–144)
Total Protein: 7.4 g/dL (ref 6.0–8.5)

## 2020-01-04 LAB — LIPID PANEL
Chol/HDL Ratio: 3.1 ratio (ref 0.0–4.4)
Cholesterol, Total: 128 mg/dL (ref 100–199)
HDL: 41 mg/dL (ref >39)
LDL Chol Calc (NIH): 68 mg/dL (ref 0–99)
Triglycerides: 102 mg/dL (ref 0–149)
VLDL Cholesterol Cal: 19 mg/dL (ref 5–40)

## 2020-01-04 LAB — TSH: TSH: 1.15 u[IU]/mL (ref 0.450–4.500)

## 2020-01-04 NOTE — Progress Notes (Signed)
Letter SENT

## 2020-01-04 NOTE — Progress Notes (Signed)
Good morning  Normal results letter, please  Thank you  Kathrin Ruddy, NP

## 2020-01-13 DIAGNOSIS — H25812 Combined forms of age-related cataract, left eye: Secondary | ICD-10-CM | POA: Diagnosis not present

## 2020-01-13 DIAGNOSIS — H2512 Age-related nuclear cataract, left eye: Secondary | ICD-10-CM | POA: Diagnosis not present

## 2020-01-27 DIAGNOSIS — H25811 Combined forms of age-related cataract, right eye: Secondary | ICD-10-CM | POA: Diagnosis not present

## 2020-01-27 DIAGNOSIS — H2511 Age-related nuclear cataract, right eye: Secondary | ICD-10-CM | POA: Diagnosis not present

## 2020-02-07 ENCOUNTER — Other Ambulatory Visit: Payer: Self-pay | Admitting: Registered Nurse

## 2020-02-07 DIAGNOSIS — E039 Hypothyroidism, unspecified: Secondary | ICD-10-CM

## 2020-02-07 DIAGNOSIS — I10 Essential (primary) hypertension: Secondary | ICD-10-CM

## 2020-02-07 MED ORDER — LABETALOL HCL 100 MG PO TABS: 100.0000 mg | ORAL_TABLET | Freq: Two times a day (BID) | ORAL | 0 refills | Status: DC

## 2020-02-07 MED ORDER — LEVOTHYROXINE SODIUM 50 MCG PO TABS: ORAL_TABLET | ORAL | 0 refills | Status: DC

## 2020-02-07 MED ORDER — LOSARTAN POTASSIUM 50 MG PO TABS: 50.0000 mg | ORAL_TABLET | Freq: Every day | ORAL | 0 refills | Status: DC

## 2020-02-07 MED ORDER — AMLODIPINE BESYLATE 5 MG PO TABS: 5.0000 mg | ORAL_TABLET | Freq: Every day | ORAL | 0 refills | Status: DC

## 2020-02-07 NOTE — Telephone Encounter (Signed)
Medication Refill - Medication:  levothyroxine (EUTHYROX) 50 MCG tablet losartan (COZAAR) 50 MG tablet labetalol (NORMODYNE) 100 MG tablet amLODipine (NORVASC) 5 MG tablet     Preferred Pharmacy (with phone number or street name):  [facility], [address] [place] Phone:  [phone]  Fax:  [phone]       Agent: Please be advised that RX refills may take up to 3 business days. We ask that you follow-up with your pharmacy.

## 2020-02-07 NOTE — Telephone Encounter (Signed)
Requested Prescriptions  Pending Prescriptions Disp Refills   levothyroxine (EUTHYROX) 50 MCG tablet 90 tablet 0    Sig: TAKE 1 TABLET BY MOUTH ONCE DAILY BEFORE BREAKFAST     Endocrinology:  Hypothyroid Agents Failed - 02/07/2020 11:55 AM      Failed - TSH needs to be rechecked within 3 months after an abnormal result. Refill until TSH is due.      Passed - TSH in normal range and within 360 days    TSH  Date Value Ref Range Status  01/03/2020 1.150 0.450 - 4.500 uIU/mL Final         Passed - Valid encounter within last 12 months    Recent Outpatient Visits          1 month ago Essential hypertension   [facility], Delfino Lovett, NP   6 months ago Essential hypertension   Primary Care at [facility], Arlie Solomons, MD   1 year ago Essential hypertension   Primary Care at [facility], [place] A, MD   1 year ago Hypothyroidism due to acquired atrophy of thyroid   Primary Care at [facility], Arlie Solomons, MD   1 year ago Medicare annual wellness visit, subsequent   Primary Care at [place], MD      Future Appointments            In 4 months Maximiano Coss, NP [facility] at [place], [facility]            losartan (COZAAR) 50 MG tablet 90 tablet 0    Sig: Take 1 tablet (50 mg total) by mouth daily.     Cardiovascular:  Angiotensin Receptor Blockers Failed - 02/07/2020 11:55 AM      Failed - Cr in normal range and within 180 days    Creat  Date Value Ref Range Status  02/15/2016 1.14 (H) 0.50 - 0.99 mg/dL Final    Comment:      For patients > or = 72 years of age: The upper reference limit for Creatinine is approximately 13% higher for people identified as African-American.      Creatinine, Ser  Date Value Ref Range Status  01/03/2020 1.05 (H) 0.57 - 1.00 mg/dL Final   Creatinine, Urine  Date Value Ref Range Status  08/15/2007 76.9 mg/dL Final         Passed - K in normal range and within 180 days    Potassium  Date Value Ref Range  Status  01/03/2020 4.1 3.5 - 5.2 mmol/L Final         Passed - Patient is not pregnant      Passed - Last BP in normal range    BP Readings from Last 1 Encounters:  01/03/20 131/72         Passed - Valid encounter within last 6 months    Recent Outpatient Visits          1 month ago Essential hypertension   [facility], Delfino Lovett, NP   6 months ago Essential hypertension   Primary Care at [facility], Arlie Solomons, MD   1 year ago Essential hypertension   Primary Care at [facility], [place] A, MD   1 year ago Hypothyroidism due to acquired atrophy of thyroid   Primary Care at [facility], Arlie Solomons, MD   1 year ago Medicare annual wellness visit, subsequent   Primary Care at [facility], Arlie Solomons, MD  Future Appointments            In 4 months Maximiano Coss, NP Primary Care at [facility], [facility]            labetalol (NORMODYNE) 100 MG tablet 180 tablet 0    Sig: Take 1 tablet (100 mg total) by mouth 2 (two) times daily.     Cardiovascular:  Beta Blockers Passed - 02/07/2020 11:55 AM      Passed - Last BP in normal range    BP Readings from Last 1 Encounters:  01/03/20 131/72         Passed - Last Heart Rate in normal range    Pulse Readings from Last 1 Encounters:  01/03/20 78         Passed - Valid encounter within last 6 months    Recent Outpatient Visits          1 month ago Essential hypertension   [facility], Delfino Lovett, NP   6 months ago Essential hypertension   Primary Care at [facility], Arlie Solomons, MD   1 year ago Essential hypertension   Primary Care at [facility], [place] A, MD   1 year ago Hypothyroidism due to acquired atrophy of thyroid   Primary Care at [facility], Arlie Solomons, MD   1 year ago Medicare annual wellness visit, subsequent   Primary Care at [place], MD      Future Appointments            In 4 months Maximiano Coss, NP Primary [facility] at [place], [facility]             amLODipine (NORVASC) 5 MG tablet 90 tablet 0    Sig: Take 1 tablet (5 mg total) by mouth daily.     Cardiovascular:  Calcium Channel Blockers Passed - 02/07/2020 11:55 AM      Passed - Last BP in normal range    BP Readings from Last 1 Encounters:  01/03/20 131/72         Passed - Valid encounter within last 6 months    Recent Outpatient Visits          1 month ago Essential hypertension   [facility], Delfino Lovett, NP   6 months ago Essential hypertension   Primary Care at [facility], Arlie Solomons, MD   1 year ago Essential hypertension   Primary Care at [facility], [place] A, MD   1 year ago Hypothyroidism due to acquired atrophy of thyroid   Primary Care at [facility], Arlie Solomons, MD   1 year ago Medicare annual wellness visit, subsequent   Primary Care at [facility], Arlie Solomons, MD      Future Appointments            In 4 months Maximiano Coss, NP Primary Care at [place], [facility]

## 2020-03-13 DIAGNOSIS — H524 Presbyopia: Secondary | ICD-10-CM | POA: Diagnosis not present

## 2020-03-13 DIAGNOSIS — H52223 Regular astigmatism, bilateral: Secondary | ICD-10-CM | POA: Diagnosis not present

## 2020-04-26 ENCOUNTER — Other Ambulatory Visit: Payer: Self-pay | Admitting: Registered Nurse

## 2020-04-26 DIAGNOSIS — I10 Essential (primary) hypertension: Secondary | ICD-10-CM

## 2020-04-26 DIAGNOSIS — E039 Hypothyroidism, unspecified: Secondary | ICD-10-CM

## 2020-07-03 ENCOUNTER — Other Ambulatory Visit: Payer: Self-pay

## 2020-07-03 ENCOUNTER — Ambulatory Visit: Payer: Medicare HMO | Admitting: Registered Nurse

## 2020-07-03 ENCOUNTER — Encounter: Payer: Self-pay | Admitting: Registered Nurse

## 2020-07-03 VITALS — BP 135/71 | HR 80 | Temp 98.0°F | Resp 18 | Ht 62.0 in | Wt 127.8 lb

## 2020-07-03 DIAGNOSIS — E782 Mixed hyperlipidemia: Secondary | ICD-10-CM

## 2020-07-03 DIAGNOSIS — E039 Hypothyroidism, unspecified: Secondary | ICD-10-CM

## 2020-07-03 DIAGNOSIS — M62838 Other muscle spasm: Secondary | ICD-10-CM | POA: Diagnosis not present

## 2020-07-03 DIAGNOSIS — H6120 Impacted cerumen, unspecified ear: Secondary | ICD-10-CM | POA: Diagnosis not present

## 2020-07-03 DIAGNOSIS — I10 Essential (primary) hypertension: Secondary | ICD-10-CM

## 2020-07-03 MED ORDER — LEVOTHYROXINE SODIUM 50 MCG PO TABS: 50.0000 ug | ORAL_TABLET | Freq: Every day | ORAL | 1 refills | Status: DC

## 2020-07-03 MED ORDER — ATORVASTATIN CALCIUM 10 MG PO TABS: 10.0000 mg | ORAL_TABLET | Freq: Every day | ORAL | 3 refills | Status: DC

## 2020-07-03 MED ORDER — LOSARTAN POTASSIUM 50 MG PO TABS: 50.0000 mg | ORAL_TABLET | Freq: Every day | ORAL | 1 refills | Status: DC

## 2020-07-03 MED ORDER — LABETALOL HCL 100 MG PO TABS: 100.0000 mg | ORAL_TABLET | Freq: Two times a day (BID) | ORAL | 1 refills | Status: DC

## 2020-07-03 MED ORDER — BACLOFEN 10 MG PO TABS: 10.0000 mg | ORAL_TABLET | Freq: Three times a day (TID) | ORAL | 1 refills | Status: DC

## 2020-07-03 MED ORDER — DEBROX 6.5 % OT SOLN: 5.0000 [drp] | Freq: Two times a day (BID) | OTIC | 0 refills | Status: DC

## 2020-07-03 MED ORDER — AMLODIPINE BESYLATE 5 MG PO TABS: 5.0000 mg | ORAL_TABLET | Freq: Every day | ORAL | 1 refills | Status: DC

## 2020-07-03 NOTE — Patient Instructions (Signed)
° ° ° °  If you have lab work done today you will be contacted with your lab results within the next 2 weeks.  If you have not heard from us then please contact us. The fastest way to get your results is to register for My Chart. ° ° °IF you received an x-ray today, you will receive an invoice from [facility]. Please contact [facility] at [phone] with questions or concerns regarding your invoice.  ° °IF you received labwork today, you will receive an invoice from [facility]. Please contact [facility] at [phone] with questions or concerns regarding your invoice.  ° °Our billing staff will not be able to assist you with questions regarding bills from these companies. ° °You will be contacted with the lab results as soon as they are available. The fastest way to get your results is to activate your My Chart account. Instructions are located on the last page of this paperwork. If you have not heard from us regarding the results in 2 weeks, please contact this office. °  ° ° ° °

## 2020-07-03 NOTE — Progress Notes (Signed)
Established Patient Office Visit  Subjective:  Patient ID: Victoria Mitchell, female    DOB: 01/24/1946  Age: 75 y.o. MRN: [id number]  CC:  Chief Complaint  Patient presents with  . Follow-up    Patient states she is here for follow up on Hypertension and medication refill.    HPI Victoria Mitchell presents for htn  Hypertension: Patient Currently taking: amlodipine 5mg  PO qd, labetalol 100mg  PO bid, losartan 50mg  PO qd Good effect. No AEs. Denies CV symptoms including: chest pain, shob, doe, headache, visual changes, fatigue, claudication, and dependent edema.   Previous readings and labs: BP Readings from Last 3 Encounters:  07/03/20 135/71  01/03/20 131/72  09/29/19 129/79   Lab Results  Component Value Date   CREATININE 1.05 (H) 01/03/2020      Past Medical History:  Diagnosis Date  . Cataract 01/03/2020  . Hypercholesterolemia   . Hypertension   . Hypokalemia   . Stroke ([place])   . Thyroid disease    hypothyroid    No past surgical history on file.  Family History  Problem Relation Age of Onset  . Tuberculosis Father   . Hypertension Brother   . Heart disease Brother   . Heart attack Brother   . Heart disease Brother   . Hypertension Brother     Social History   Socioeconomic History  . Marital status: Married    Spouse name: Eduard Clos  . Number of children: 0  . Years of education: Not on file  . Highest education level: 11th grade  Occupational History  . Not on file  Tobacco Use  . Smoking status: Former Smoker    Packs/day: 0.50    Years: 30.00    Pack years: 15.00  . Smokeless tobacco: Never Used  Vaping Use  . Vaping Use: Never used  Substance and Sexual Activity  . Alcohol use: No  . Drug use: No  . Sexual activity: Yes  Other Topics Concern  . Not on file  Social History Narrative  . Not on file   Social Determinants of Health   Financial Resource Strain: Not on file  Food Insecurity: Not on file  Transportation Needs: Not on  file  Physical Activity: Not on file  Stress: Not on file  Social Connections: Not on file  Intimate Partner Violence: Not on file    Outpatient Medications Prior to Visit  Medication Sig Dispense Refill  . amLODipine (NORVASC) 5 MG tablet Take 1 tablet by mouth once daily 90 tablet 0  . atorvastatin (LIPITOR) 10 MG tablet Take 1 tablet (10 mg total) by mouth daily. 90 tablet 3  . baclofen (LIORESAL) 10 MG tablet Take 1 tablet (10 mg total) by mouth 3 (three) times daily. 180 tablet 1  . carbamide peroxide (DEBROX) 6.5 % OTIC solution Place 5 drops into the right ear 2 (two) times daily. 15 mL 0  . EUTHYROX 50 MCG tablet TAKE 1 TABLET BY MOUTH ONCE DAILY BEFORE BREAKFAST 90 tablet 0  . labetalol (NORMODYNE) 100 MG tablet Take 1 tablet by mouth twice daily 174 tablet 0  . losartan (COZAAR) 50 MG tablet Take 1 tablet by mouth once daily 90 tablet 0   No facility-administered medications prior to visit.    No Known Allergies  ROS Review of Systems  Constitutional: Negative.   HENT: Negative.   Eyes: Negative.   Respiratory: Negative.   Cardiovascular: Negative.   Gastrointestinal: Negative.   Genitourinary: Negative.   Musculoskeletal: Negative.  Skin: Negative.   Neurological: Negative.   Psychiatric/Behavioral: Negative.   All other systems reviewed and are negative.     Objective:    Physical Exam Vitals and nursing note reviewed.  Constitutional:      General: She is not in acute distress.    Appearance: Normal appearance. She is normal weight. She is not ill-appearing, toxic-appearing or diaphoretic.  Cardiovascular:     Rate and Rhythm: Normal rate and regular rhythm.     Heart sounds: Normal heart sounds. No murmur heard. No friction rub. No gallop.   Pulmonary:     Effort: Pulmonary effort is normal. No respiratory distress.     Breath sounds: Normal breath sounds. No stridor. No wheezing, rhonchi or rales.  Chest:     Chest wall: No tenderness.  Skin:     General: Skin is warm and dry.  Neurological:     General: No focal deficit present.     Mental Status: She is alert and oriented to person, place, and time. Mental status is at baseline.  Psychiatric:        Mood and Affect: Mood normal.        Behavior: Behavior normal.        Thought Content: Thought content normal.        Judgment: Judgment normal.     BP 135/71   Pulse 80   Temp 98 F (36.7 C) (Temporal)   Resp 18   Ht 5\' 2"  (1.575 m)   Wt 127 lb 12.8 oz (58 kg)   SpO2 94%   BMI 23.37 kg/m  Wt Readings from Last 3 Encounters:  07/03/20 127 lb 12.8 oz (58 kg)  01/03/20 127 lb (57.6 kg)  09/29/19 128 lb (58.1 kg)     Health Maintenance Due  Topic Date Due  . MAMMOGRAM  06/22/2011    There are no preventive care reminders to display for this patient.  Lab Results  Component Value Date   TSH 1.150 01/03/2020   Lab Results  Component Value Date   WBC 7.9 11/13/2017   HGB 14.3 11/13/2017   HCT 41.3 11/13/2017   MCV 92 11/13/2017   PLT 193 11/13/2017   Lab Results  Component Value Date   NA 140 01/03/2020   K 4.1 01/03/2020   CO2 21 01/03/2020   GLUCOSE 99 01/03/2020   BUN 8 01/03/2020   CREATININE 1.05 (H) 01/03/2020   BILITOT 0.6 01/03/2020   ALKPHOS 78 01/03/2020   AST 18 01/03/2020   ALT 12 01/03/2020   PROT 7.4 01/03/2020   ALBUMIN 4.1 01/03/2020   CALCIUM 9.7 01/03/2020   Lab Results  Component Value Date   CHOL 128 01/03/2020   Lab Results  Component Value Date   HDL 41 01/03/2020   Lab Results  Component Value Date   LDLCALC 68 01/03/2020   Lab Results  Component Value Date   TRIG 102 01/03/2020   Lab Results  Component Value Date   CHOLHDL 3.1 01/03/2020   Lab Results  Component Value Date   HGBA1C 6.0 (H) 11/13/2017      Assessment & Plan:   Problem List Items Addressed This Visit      Cardiovascular and Mediastinum   Essential hypertension   Relevant Medications   atorvastatin (LIPITOR) 10 MG tablet    amLODipine (NORVASC) 5 MG tablet   labetalol (NORMODYNE) 100 MG tablet   losartan (COZAAR) 50 MG tablet     Endocrine   Hypothyroidism  Relevant Medications   levothyroxine (EUTHYROX) 50 MCG tablet   labetalol (NORMODYNE) 100 MG tablet     Other   MIXED HYPERLIPIDEMIA   Relevant Medications   atorvastatin (LIPITOR) 10 MG tablet   amLODipine (NORVASC) 5 MG tablet   labetalol (NORMODYNE) 100 MG tablet   losartan (COZAAR) 50 MG tablet    Other Visit Diagnoses    Impacted cerumen, unspecified laterality    -  Primary   Relevant Medications   carbamide peroxide (DEBROX) 6.5 % OTIC solution   Muscle spasm of right lower extremity       Relevant Medications   baclofen (LIORESAL) 10 MG tablet      No orders of the defined types were placed in this encounter.   Follow-up: No follow-ups on file.   PLAN  Refill all meds  Return in 6 mo for labs and htn  Patient encouraged to call clinic with any questions, comments, or concerns.  Maximiano Coss, NP

## 2020-07-11 ENCOUNTER — Other Ambulatory Visit: Payer: Self-pay | Admitting: Registered Nurse

## 2020-07-11 ENCOUNTER — Other Ambulatory Visit: Payer: Self-pay

## 2020-07-11 DIAGNOSIS — I10 Essential (primary) hypertension: Secondary | ICD-10-CM

## 2020-07-11 DIAGNOSIS — E782 Mixed hyperlipidemia: Secondary | ICD-10-CM

## 2020-07-11 DIAGNOSIS — M62838 Other muscle spasm: Secondary | ICD-10-CM

## 2020-07-11 DIAGNOSIS — E039 Hypothyroidism, unspecified: Secondary | ICD-10-CM

## 2020-07-11 DIAGNOSIS — H6120 Impacted cerumen, unspecified ear: Secondary | ICD-10-CM

## 2020-07-11 NOTE — Telephone Encounter (Signed)
Patient states that none of her prescriptions that were sent in on 07/03/2020 were received.  Please contact patient.

## 2020-07-11 NOTE — Telephone Encounter (Signed)
Called pharmacy to verify they had not yet received Rx sent 07/03/2020 please resend refills. Pended below

## 2020-07-19 MED ORDER — LEVOTHYROXINE SODIUM 50 MCG PO TABS: 50.0000 ug | ORAL_TABLET | Freq: Every day | ORAL | 1 refills | Status: DC

## 2020-07-19 MED ORDER — LABETALOL HCL 100 MG PO TABS: 100.0000 mg | ORAL_TABLET | Freq: Two times a day (BID) | ORAL | 1 refills | Status: DC

## 2020-07-19 MED ORDER — DEBROX 6.5 % OT SOLN: 5.0000 [drp] | Freq: Two times a day (BID) | OTIC | 0 refills | Status: AC

## 2020-07-19 MED ORDER — BACLOFEN 10 MG PO TABS: 10.0000 mg | ORAL_TABLET | Freq: Three times a day (TID) | ORAL | 1 refills | Status: AC

## 2020-07-19 MED ORDER — AMLODIPINE BESYLATE 5 MG PO TABS: 5.0000 mg | ORAL_TABLET | Freq: Every day | ORAL | 1 refills | Status: DC

## 2020-07-19 MED ORDER — ATORVASTATIN CALCIUM 10 MG PO TABS: 10.0000 mg | ORAL_TABLET | Freq: Every day | ORAL | 3 refills | Status: DC

## 2020-07-19 MED ORDER — LOSARTAN POTASSIUM 50 MG PO TABS: 50.0000 mg | ORAL_TABLET | Freq: Every day | ORAL | 1 refills | Status: DC

## 2021-01-24 ENCOUNTER — Other Ambulatory Visit: Payer: Self-pay | Admitting: Registered Nurse

## 2021-01-24 DIAGNOSIS — E039 Hypothyroidism, unspecified: Secondary | ICD-10-CM

## 2021-01-24 DIAGNOSIS — I10 Essential (primary) hypertension: Secondary | ICD-10-CM

## 2021-01-31 ENCOUNTER — Other Ambulatory Visit: Payer: Self-pay

## 2021-01-31 DIAGNOSIS — I10 Essential (primary) hypertension: Secondary | ICD-10-CM

## 2021-01-31 MED ORDER — AMLODIPINE BESYLATE 5 MG PO TABS: 5.0000 mg | ORAL_TABLET | Freq: Every day | ORAL | 1 refills | Status: DC

## 2021-01-31 MED ORDER — LOSARTAN POTASSIUM 50 MG PO TABS: 50.0000 mg | ORAL_TABLET | Freq: Every day | ORAL | 1 refills | Status: DC

## 2021-03-15 DIAGNOSIS — I10 Essential (primary) hypertension: Secondary | ICD-10-CM | POA: Diagnosis not present

## 2021-03-15 DIAGNOSIS — E782 Mixed hyperlipidemia: Secondary | ICD-10-CM | POA: Diagnosis not present

## 2021-03-15 DIAGNOSIS — Z8673 Personal history of transient ischemic attack (TIA), and cerebral infarction without residual deficits: Secondary | ICD-10-CM | POA: Diagnosis not present

## 2021-03-15 DIAGNOSIS — Z0000 Encounter for general adult medical examination without abnormal findings: Secondary | ICD-10-CM | POA: Diagnosis not present

## 2021-03-15 DIAGNOSIS — E038 Other specified hypothyroidism: Secondary | ICD-10-CM | POA: Diagnosis not present

## 2021-03-15 DIAGNOSIS — Z131 Encounter for screening for diabetes mellitus: Secondary | ICD-10-CM | POA: Diagnosis not present

## 2021-03-15 DIAGNOSIS — R739 Hyperglycemia, unspecified: Secondary | ICD-10-CM | POA: Diagnosis not present

## 2021-04-02 DIAGNOSIS — I10 Essential (primary) hypertension: Secondary | ICD-10-CM | POA: Diagnosis not present

## 2021-04-02 DIAGNOSIS — R7303 Prediabetes: Secondary | ICD-10-CM | POA: Diagnosis not present

## 2021-04-02 DIAGNOSIS — E782 Mixed hyperlipidemia: Secondary | ICD-10-CM | POA: Diagnosis not present

## 2021-04-02 DIAGNOSIS — Z8673 Personal history of transient ischemic attack (TIA), and cerebral infarction without residual deficits: Secondary | ICD-10-CM | POA: Diagnosis not present

## 2021-04-02 DIAGNOSIS — E038 Other specified hypothyroidism: Secondary | ICD-10-CM | POA: Diagnosis not present

## 2021-07-02 DIAGNOSIS — E782 Mixed hyperlipidemia: Secondary | ICD-10-CM | POA: Diagnosis not present

## 2021-07-02 DIAGNOSIS — I10 Essential (primary) hypertension: Secondary | ICD-10-CM | POA: Diagnosis not present

## 2021-07-02 DIAGNOSIS — Z8673 Personal history of transient ischemic attack (TIA), and cerebral infarction without residual deficits: Secondary | ICD-10-CM | POA: Diagnosis not present

## 2021-07-02 DIAGNOSIS — R7303 Prediabetes: Secondary | ICD-10-CM | POA: Diagnosis not present

## 2021-07-02 DIAGNOSIS — E038 Other specified hypothyroidism: Secondary | ICD-10-CM | POA: Diagnosis not present

## 2021-07-22 ENCOUNTER — Other Ambulatory Visit: Payer: Self-pay | Admitting: Registered Nurse

## 2021-07-22 DIAGNOSIS — I10 Essential (primary) hypertension: Secondary | ICD-10-CM

## 2021-09-01 ENCOUNTER — Other Ambulatory Visit: Payer: Self-pay | Admitting: Registered Nurse

## 2021-09-01 DIAGNOSIS — E782 Mixed hyperlipidemia: Secondary | ICD-10-CM

## 2021-10-02 DIAGNOSIS — Z8673 Personal history of transient ischemic attack (TIA), and cerebral infarction without residual deficits: Secondary | ICD-10-CM | POA: Diagnosis not present

## 2021-10-02 DIAGNOSIS — R7303 Prediabetes: Secondary | ICD-10-CM | POA: Diagnosis not present

## 2021-10-02 DIAGNOSIS — I10 Essential (primary) hypertension: Secondary | ICD-10-CM | POA: Diagnosis not present

## 2021-10-02 DIAGNOSIS — Z0000 Encounter for general adult medical examination without abnormal findings: Secondary | ICD-10-CM | POA: Diagnosis not present

## 2021-10-02 DIAGNOSIS — E038 Other specified hypothyroidism: Secondary | ICD-10-CM | POA: Diagnosis not present

## 2021-10-02 DIAGNOSIS — E782 Mixed hyperlipidemia: Secondary | ICD-10-CM | POA: Diagnosis not present

## 2021-10-13 ENCOUNTER — Other Ambulatory Visit: Payer: Self-pay | Admitting: Registered Nurse

## 2021-10-13 DIAGNOSIS — E782 Mixed hyperlipidemia: Secondary | ICD-10-CM

## 2021-10-22 ENCOUNTER — Other Ambulatory Visit: Payer: Self-pay | Admitting: Registered Nurse

## 2021-10-22 DIAGNOSIS — I10 Essential (primary) hypertension: Secondary | ICD-10-CM

## 2021-10-22 DIAGNOSIS — E782 Mixed hyperlipidemia: Secondary | ICD-10-CM

## 2021-11-15 ENCOUNTER — Other Ambulatory Visit: Payer: Self-pay | Admitting: Registered Nurse

## 2021-11-15 DIAGNOSIS — I10 Essential (primary) hypertension: Secondary | ICD-10-CM

## 2022-02-01 DIAGNOSIS — I10 Essential (primary) hypertension: Secondary | ICD-10-CM | POA: Diagnosis not present

## 2022-02-01 DIAGNOSIS — R7303 Prediabetes: Secondary | ICD-10-CM | POA: Diagnosis not present

## 2022-02-01 DIAGNOSIS — Z8673 Personal history of transient ischemic attack (TIA), and cerebral infarction without residual deficits: Secondary | ICD-10-CM | POA: Diagnosis not present

## 2022-02-01 DIAGNOSIS — E782 Mixed hyperlipidemia: Secondary | ICD-10-CM | POA: Diagnosis not present

## 2022-02-01 DIAGNOSIS — Z0000 Encounter for general adult medical examination without abnormal findings: Secondary | ICD-10-CM | POA: Diagnosis not present

## 2022-02-01 DIAGNOSIS — E038 Other specified hypothyroidism: Secondary | ICD-10-CM | POA: Diagnosis not present

## 2022-03-29 DIAGNOSIS — Z8673 Personal history of transient ischemic attack (TIA), and cerebral infarction without residual deficits: Secondary | ICD-10-CM | POA: Diagnosis not present

## 2022-03-29 DIAGNOSIS — R7303 Prediabetes: Secondary | ICD-10-CM | POA: Diagnosis not present

## 2022-03-29 DIAGNOSIS — E038 Other specified hypothyroidism: Secondary | ICD-10-CM | POA: Diagnosis not present

## 2022-03-29 DIAGNOSIS — I10 Essential (primary) hypertension: Secondary | ICD-10-CM | POA: Diagnosis not present

## 2022-03-29 DIAGNOSIS — E782 Mixed hyperlipidemia: Secondary | ICD-10-CM | POA: Diagnosis not present

## 2022-04-04 DIAGNOSIS — M19042 Primary osteoarthritis, left hand: Secondary | ICD-10-CM | POA: Diagnosis not present

## 2022-04-04 DIAGNOSIS — E782 Mixed hyperlipidemia: Secondary | ICD-10-CM | POA: Diagnosis not present

## 2022-04-04 DIAGNOSIS — E038 Other specified hypothyroidism: Secondary | ICD-10-CM | POA: Diagnosis not present

## 2022-04-04 DIAGNOSIS — R7303 Prediabetes: Secondary | ICD-10-CM | POA: Diagnosis not present

## 2022-04-04 DIAGNOSIS — I10 Essential (primary) hypertension: Secondary | ICD-10-CM | POA: Diagnosis not present

## 2022-04-04 DIAGNOSIS — Z8673 Personal history of transient ischemic attack (TIA), and cerebral infarction without residual deficits: Secondary | ICD-10-CM | POA: Diagnosis not present

## 2022-07-04 ENCOUNTER — Other Ambulatory Visit: Payer: Self-pay | Admitting: Internal Medicine

## 2022-07-04 DIAGNOSIS — I10 Essential (primary) hypertension: Secondary | ICD-10-CM | POA: Diagnosis not present

## 2022-07-04 DIAGNOSIS — M19042 Primary osteoarthritis, left hand: Secondary | ICD-10-CM | POA: Diagnosis not present

## 2022-07-04 DIAGNOSIS — Z1231 Encounter for screening mammogram for malignant neoplasm of breast: Secondary | ICD-10-CM

## 2022-07-04 DIAGNOSIS — Z8673 Personal history of transient ischemic attack (TIA), and cerebral infarction without residual deficits: Secondary | ICD-10-CM | POA: Diagnosis not present

## 2022-07-04 DIAGNOSIS — E782 Mixed hyperlipidemia: Secondary | ICD-10-CM | POA: Diagnosis not present

## 2022-07-04 DIAGNOSIS — R7303 Prediabetes: Secondary | ICD-10-CM | POA: Diagnosis not present

## 2022-07-04 DIAGNOSIS — E038 Other specified hypothyroidism: Secondary | ICD-10-CM | POA: Diagnosis not present

## 2022-09-30 DIAGNOSIS — I10 Essential (primary) hypertension: Secondary | ICD-10-CM | POA: Diagnosis not present

## 2022-09-30 DIAGNOSIS — E038 Other specified hypothyroidism: Secondary | ICD-10-CM | POA: Diagnosis not present

## 2022-09-30 DIAGNOSIS — E782 Mixed hyperlipidemia: Secondary | ICD-10-CM | POA: Diagnosis not present

## 2022-09-30 DIAGNOSIS — Z8673 Personal history of transient ischemic attack (TIA), and cerebral infarction without residual deficits: Secondary | ICD-10-CM | POA: Diagnosis not present

## 2022-09-30 DIAGNOSIS — R7303 Prediabetes: Secondary | ICD-10-CM | POA: Diagnosis not present

## 2022-09-30 DIAGNOSIS — Z0001 Encounter for general adult medical examination with abnormal findings: Secondary | ICD-10-CM | POA: Diagnosis not present

## 2022-10-14 DIAGNOSIS — R7303 Prediabetes: Secondary | ICD-10-CM | POA: Diagnosis not present

## 2022-10-14 DIAGNOSIS — I10 Essential (primary) hypertension: Secondary | ICD-10-CM | POA: Diagnosis not present

## 2022-10-14 DIAGNOSIS — N289 Disorder of kidney and ureter, unspecified: Secondary | ICD-10-CM | POA: Diagnosis not present

## 2022-10-14 DIAGNOSIS — Z8673 Personal history of transient ischemic attack (TIA), and cerebral infarction without residual deficits: Secondary | ICD-10-CM | POA: Diagnosis not present

## 2022-10-14 DIAGNOSIS — E038 Other specified hypothyroidism: Secondary | ICD-10-CM | POA: Diagnosis not present

## 2022-10-14 DIAGNOSIS — E782 Mixed hyperlipidemia: Secondary | ICD-10-CM | POA: Diagnosis not present

## 2023-01-20 DIAGNOSIS — E038 Other specified hypothyroidism: Secondary | ICD-10-CM | POA: Diagnosis not present

## 2023-01-20 DIAGNOSIS — E782 Mixed hyperlipidemia: Secondary | ICD-10-CM | POA: Diagnosis not present

## 2023-01-20 DIAGNOSIS — R7303 Prediabetes: Secondary | ICD-10-CM | POA: Diagnosis not present

## 2023-01-20 DIAGNOSIS — N289 Disorder of kidney and ureter, unspecified: Secondary | ICD-10-CM | POA: Diagnosis not present

## 2023-01-20 DIAGNOSIS — Z8673 Personal history of transient ischemic attack (TIA), and cerebral infarction without residual deficits: Secondary | ICD-10-CM | POA: Diagnosis not present

## 2023-01-20 DIAGNOSIS — I10 Essential (primary) hypertension: Secondary | ICD-10-CM | POA: Diagnosis not present

## 2023-04-22 ENCOUNTER — Other Ambulatory Visit: Payer: Self-pay | Admitting: Internal Medicine

## 2023-04-22 DIAGNOSIS — E038 Other specified hypothyroidism: Secondary | ICD-10-CM | POA: Diagnosis not present

## 2023-04-22 DIAGNOSIS — Z1231 Encounter for screening mammogram for malignant neoplasm of breast: Secondary | ICD-10-CM

## 2023-04-22 DIAGNOSIS — I10 Essential (primary) hypertension: Secondary | ICD-10-CM | POA: Diagnosis not present

## 2023-04-22 DIAGNOSIS — Z8673 Personal history of transient ischemic attack (TIA), and cerebral infarction without residual deficits: Secondary | ICD-10-CM | POA: Diagnosis not present

## 2023-04-22 DIAGNOSIS — N289 Disorder of kidney and ureter, unspecified: Secondary | ICD-10-CM | POA: Diagnosis not present

## 2023-04-22 DIAGNOSIS — E782 Mixed hyperlipidemia: Secondary | ICD-10-CM | POA: Diagnosis not present

## 2023-04-22 DIAGNOSIS — R7303 Prediabetes: Secondary | ICD-10-CM | POA: Diagnosis not present

## 2023-05-26 DIAGNOSIS — R7303 Prediabetes: Secondary | ICD-10-CM | POA: Diagnosis not present

## 2023-05-26 DIAGNOSIS — Z8673 Personal history of transient ischemic attack (TIA), and cerebral infarction without residual deficits: Secondary | ICD-10-CM | POA: Diagnosis not present

## 2023-05-26 DIAGNOSIS — E038 Other specified hypothyroidism: Secondary | ICD-10-CM | POA: Diagnosis not present

## 2023-05-26 DIAGNOSIS — I10 Essential (primary) hypertension: Secondary | ICD-10-CM | POA: Diagnosis not present

## 2023-05-26 DIAGNOSIS — E782 Mixed hyperlipidemia: Secondary | ICD-10-CM | POA: Diagnosis not present

## 2023-05-26 DIAGNOSIS — N289 Disorder of kidney and ureter, unspecified: Secondary | ICD-10-CM | POA: Diagnosis not present

## 2023-07-21 DIAGNOSIS — I10 Essential (primary) hypertension: Secondary | ICD-10-CM | POA: Diagnosis not present

## 2023-07-21 DIAGNOSIS — E038 Other specified hypothyroidism: Secondary | ICD-10-CM | POA: Diagnosis not present

## 2023-07-21 DIAGNOSIS — Z8673 Personal history of transient ischemic attack (TIA), and cerebral infarction without residual deficits: Secondary | ICD-10-CM | POA: Diagnosis not present

## 2023-07-21 DIAGNOSIS — N289 Disorder of kidney and ureter, unspecified: Secondary | ICD-10-CM | POA: Diagnosis not present

## 2023-07-21 DIAGNOSIS — R771 Abnormality of globulin: Secondary | ICD-10-CM | POA: Diagnosis not present

## 2023-07-21 DIAGNOSIS — R7303 Prediabetes: Secondary | ICD-10-CM | POA: Diagnosis not present

## 2023-07-21 DIAGNOSIS — E782 Mixed hyperlipidemia: Secondary | ICD-10-CM | POA: Diagnosis not present

## 2023-08-13 DIAGNOSIS — N1831 Chronic kidney disease, stage 3a: Secondary | ICD-10-CM | POA: Diagnosis not present

## 2023-08-13 DIAGNOSIS — I129 Hypertensive chronic kidney disease with stage 1 through stage 4 chronic kidney disease, or unspecified chronic kidney disease: Secondary | ICD-10-CM | POA: Diagnosis not present

## 2023-08-13 DIAGNOSIS — I639 Cerebral infarction, unspecified: Secondary | ICD-10-CM | POA: Diagnosis not present

## 2023-08-13 DIAGNOSIS — E785 Hyperlipidemia, unspecified: Secondary | ICD-10-CM | POA: Diagnosis not present

## 2023-08-18 ENCOUNTER — Other Ambulatory Visit: Payer: Self-pay | Admitting: Nephrology

## 2023-08-18 DIAGNOSIS — N1831 Chronic kidney disease, stage 3a: Secondary | ICD-10-CM

## 2023-09-01 DIAGNOSIS — N1831 Chronic kidney disease, stage 3a: Secondary | ICD-10-CM | POA: Diagnosis not present

## 2023-09-01 DIAGNOSIS — Z8673 Personal history of transient ischemic attack (TIA), and cerebral infarction without residual deficits: Secondary | ICD-10-CM | POA: Diagnosis not present

## 2023-09-01 DIAGNOSIS — I10 Essential (primary) hypertension: Secondary | ICD-10-CM | POA: Diagnosis not present

## 2023-09-01 DIAGNOSIS — E038 Other specified hypothyroidism: Secondary | ICD-10-CM | POA: Diagnosis not present

## 2023-09-01 DIAGNOSIS — E782 Mixed hyperlipidemia: Secondary | ICD-10-CM | POA: Diagnosis not present

## 2023-09-01 DIAGNOSIS — R7303 Prediabetes: Secondary | ICD-10-CM | POA: Diagnosis not present

## 2023-10-03 DIAGNOSIS — R051 Acute cough: Secondary | ICD-10-CM | POA: Diagnosis not present

## 2023-10-03 DIAGNOSIS — J40 Bronchitis, not specified as acute or chronic: Secondary | ICD-10-CM | POA: Diagnosis not present

## 2023-11-27 DIAGNOSIS — Z0001 Encounter for general adult medical examination with abnormal findings: Secondary | ICD-10-CM | POA: Diagnosis not present

## 2023-11-27 DIAGNOSIS — Z8673 Personal history of transient ischemic attack (TIA), and cerebral infarction without residual deficits: Secondary | ICD-10-CM | POA: Diagnosis not present

## 2023-11-27 DIAGNOSIS — R7303 Prediabetes: Secondary | ICD-10-CM | POA: Diagnosis not present

## 2023-11-27 DIAGNOSIS — E782 Mixed hyperlipidemia: Secondary | ICD-10-CM | POA: Diagnosis not present

## 2023-11-27 DIAGNOSIS — N1831 Chronic kidney disease, stage 3a: Secondary | ICD-10-CM | POA: Diagnosis not present

## 2023-11-27 DIAGNOSIS — I10 Essential (primary) hypertension: Secondary | ICD-10-CM | POA: Diagnosis not present

## 2023-11-27 DIAGNOSIS — E038 Other specified hypothyroidism: Secondary | ICD-10-CM | POA: Diagnosis not present

## 2024-01-08 DIAGNOSIS — R7303 Prediabetes: Secondary | ICD-10-CM | POA: Diagnosis not present

## 2024-01-08 DIAGNOSIS — Z8673 Personal history of transient ischemic attack (TIA), and cerebral infarction without residual deficits: Secondary | ICD-10-CM | POA: Diagnosis not present

## 2024-01-08 DIAGNOSIS — E782 Mixed hyperlipidemia: Secondary | ICD-10-CM | POA: Diagnosis not present

## 2024-01-08 DIAGNOSIS — I10 Essential (primary) hypertension: Secondary | ICD-10-CM | POA: Diagnosis not present

## 2024-01-08 DIAGNOSIS — E038 Other specified hypothyroidism: Secondary | ICD-10-CM | POA: Diagnosis not present

## 2024-01-08 DIAGNOSIS — N1831 Chronic kidney disease, stage 3a: Secondary | ICD-10-CM | POA: Diagnosis not present

## 2024-02-04 DIAGNOSIS — N1831 Chronic kidney disease, stage 3a: Secondary | ICD-10-CM | POA: Diagnosis not present

## 2024-02-12 ENCOUNTER — Other Ambulatory Visit: Payer: Self-pay | Admitting: Nephrology

## 2024-02-12 DIAGNOSIS — R7303 Prediabetes: Secondary | ICD-10-CM | POA: Diagnosis not present

## 2024-02-12 DIAGNOSIS — N1831 Chronic kidney disease, stage 3a: Secondary | ICD-10-CM | POA: Diagnosis not present

## 2024-02-12 DIAGNOSIS — I639 Cerebral infarction, unspecified: Secondary | ICD-10-CM | POA: Diagnosis not present

## 2024-02-12 DIAGNOSIS — I129 Hypertensive chronic kidney disease with stage 1 through stage 4 chronic kidney disease, or unspecified chronic kidney disease: Secondary | ICD-10-CM | POA: Diagnosis not present

## 2024-02-12 DIAGNOSIS — E785 Hyperlipidemia, unspecified: Secondary | ICD-10-CM | POA: Diagnosis not present
